# Patient Record
Sex: Female | Born: 1990 | Race: White | Hispanic: No | Marital: Single | Smoking: Never smoker
Health system: Southern US, Community
[De-identification: ages and names within clinical notes are randomized; demographics above are authoritative.]

## PROBLEM LIST (undated history)

## (undated) DIAGNOSIS — R1115 Cyclical vomiting syndrome unrelated to migraine: Secondary | ICD-10-CM

## (undated) DIAGNOSIS — F32A Depression, unspecified: Secondary | ICD-10-CM

## (undated) DIAGNOSIS — F419 Anxiety disorder, unspecified: Secondary | ICD-10-CM

## (undated) DIAGNOSIS — J45909 Unspecified asthma, uncomplicated: Secondary | ICD-10-CM

## (undated) DIAGNOSIS — G43909 Migraine, unspecified, not intractable, without status migrainosus: Secondary | ICD-10-CM

## (undated) DIAGNOSIS — N83209 Unspecified ovarian cyst, unspecified side: Secondary | ICD-10-CM

## (undated) DIAGNOSIS — F329 Major depressive disorder, single episode, unspecified: Secondary | ICD-10-CM

## (undated) HISTORY — PX: OTHER SURGICAL HISTORY: SHX169

## (undated) HISTORY — PX: ESOPHAGOGASTRODUODENOSCOPY: SHX1529

---
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
## 2011-09-26 ENCOUNTER — Encounter (HOSPITAL_COMMUNITY): Payer: Self-pay | Admitting: *Deleted

## 2011-09-26 ENCOUNTER — Observation Stay (HOSPITAL_COMMUNITY)
Admission: EM | Admit: 2011-09-26 | Discharge: 2011-09-26 | Disposition: A | Discharge status: Home / Self Care | Payer: Private Health Insurance - Indemnity | Attending: Emergency Medicine | Admitting: Emergency Medicine

## 2011-09-26 ENCOUNTER — Observation Stay (HOSPITAL_COMMUNITY): Payer: Private Health Insurance - Indemnity

## 2011-09-26 DIAGNOSIS — F329 Major depressive disorder, single episode, unspecified: Secondary | ICD-10-CM | POA: Insufficient documentation

## 2011-09-26 DIAGNOSIS — N83209 Unspecified ovarian cyst, unspecified side: Secondary | ICD-10-CM | POA: Insufficient documentation

## 2011-09-26 DIAGNOSIS — F411 Generalized anxiety disorder: Secondary | ICD-10-CM | POA: Insufficient documentation

## 2011-09-26 DIAGNOSIS — J45909 Unspecified asthma, uncomplicated: Secondary | ICD-10-CM | POA: Insufficient documentation

## 2011-09-26 DIAGNOSIS — E876 Hypokalemia: Secondary | ICD-10-CM

## 2011-09-26 DIAGNOSIS — F3289 Other specified depressive episodes: Secondary | ICD-10-CM | POA: Insufficient documentation

## 2011-09-26 DIAGNOSIS — R1011 Right upper quadrant pain: Principal | ICD-10-CM | POA: Insufficient documentation

## 2011-09-26 DIAGNOSIS — R112 Nausea with vomiting, unspecified: Secondary | ICD-10-CM | POA: Insufficient documentation

## 2011-09-26 DIAGNOSIS — E860 Dehydration: Secondary | ICD-10-CM | POA: Insufficient documentation

## 2011-09-26 HISTORY — DX: Unspecified asthma, uncomplicated: J45.909

## 2011-09-26 HISTORY — DX: Unspecified ovarian cyst, unspecified side: N83.209

## 2011-09-26 HISTORY — DX: Depression, unspecified: F32.A

## 2011-09-26 HISTORY — DX: Major depressive disorder, single episode, unspecified: F32.9

## 2011-09-26 HISTORY — DX: Migraine, unspecified, not intractable, without status migrainosus: G43.909

## 2011-09-26 HISTORY — DX: Anxiety disorder, unspecified: F41.9

## 2011-09-26 LAB — POCT I-STAT, CHEM 8
BUN: 21 mg/dL (ref 6–23)
Calcium, Ion: 1.04 mmol/L — ABNORMAL LOW (ref 1.12–1.23)
Chloride: 106 meq/L (ref 96–112)
Creatinine, Ser: 0.7 mg/dL (ref 0.50–1.10)
Glucose, Bld: 86 mg/dL (ref 70–99)
HCT: 36 % (ref 36.0–46.0)
Hemoglobin: 12.2 g/dL (ref 12.0–15.0)
Potassium: 3.2 meq/L — ABNORMAL LOW (ref 3.5–5.1)
Sodium: 142 meq/L (ref 135–145)
TCO2: 21 mmol/L (ref 0–100)

## 2011-09-26 LAB — COMPREHENSIVE METABOLIC PANEL WITH GFR
ALT: 9 U/L (ref 0–35)
AST: 13 U/L (ref 0–37)
Albumin: 5 g/dL (ref 3.5–5.2)
Alkaline Phosphatase: 78 U/L (ref 39–117)
BUN: 27 mg/dL — ABNORMAL HIGH (ref 6–23)
CO2: 23 meq/L (ref 19–32)
Calcium: 10.2 mg/dL (ref 8.4–10.5)
Chloride: 96 meq/L (ref 96–112)
Creatinine, Ser: 0.78 mg/dL (ref 0.50–1.10)
GFR calc Af Amer: >90 mL/min (ref >90)
GFR calc non Af Amer: >90 mL/min (ref >90)
Glucose, Bld: 111 mg/dL — ABNORMAL HIGH (ref 70–99)
Potassium: 2.8 meq/L — ABNORMAL LOW (ref 3.5–5.1)
Sodium: 137 meq/L (ref 135–145)
Total Bilirubin: 1.1 mg/dL (ref 0.3–1.2)
Total Protein: 8.8 g/dL — ABNORMAL HIGH (ref 6.0–8.3)

## 2011-09-26 LAB — URINALYSIS, ROUTINE W REFLEX MICROSCOPIC
Nitrite: NEGATIVE
Specific Gravity, Urine: 1.027 (ref 1.005–1.030)
Urobilinogen, UA: 1 mg/dL (ref 0.0–1.0)

## 2011-09-26 LAB — CBC WITH DIFFERENTIAL/PLATELET
Eosinophils Relative: 0 % (ref 0–5)
HCT: 45.5 % (ref 36.0–46.0)
Hemoglobin: 16.4 g/dL — ABNORMAL HIGH (ref 12.0–15.0)
Lymphocytes Relative: 28 % (ref 12–46)
Lymphs Abs: 2.4 10*3/uL (ref 0.7–4.0)
MCV: 82 fL (ref 78.0–100.0)
Monocytes Absolute: 0.8 10*3/uL (ref 0.1–1.0)
Neutro Abs: 5.3 10*3/uL (ref 1.7–7.7)
RBC: 5.55 MIL/uL — ABNORMAL HIGH (ref 3.87–5.11)
WBC: 8.5 10*3/uL (ref 4.0–10.5)

## 2011-09-26 LAB — PREGNANCY, URINE: Preg Test, Ur: NEGATIVE

## 2011-09-26 LAB — URINE MICROSCOPIC-ADD ON

## 2011-09-26 LAB — LIPASE, BLOOD: Lipase: 32 U/L (ref 11–59)

## 2011-09-26 MED ORDER — ONDANSETRON HCL 4 MG/2ML IJ SOLN
4.0000 mg | Freq: Once | INTRAMUSCULAR | Status: AC
  Administered 2011-09-26: 4 mg via INTRAVENOUS

## 2011-09-26 MED ORDER — SODIUM CHLORIDE 0.9 % IV SOLN: 1000.0000 mL | Freq: Once | INTRAVENOUS | Status: DC

## 2011-09-26 MED ORDER — ONDANSETRON HCL 4 MG/2ML IJ SOLN
4.0000 mg | Freq: Once | INTRAMUSCULAR | Status: AC
  Administered 2011-09-26: 4 mg via INTRAVENOUS
  Filled 2011-09-26: qty 2

## 2011-09-26 MED ORDER — SODIUM CHLORIDE 0.9 % IV SOLN
1000.0000 mL | Freq: Once | INTRAVENOUS | Status: AC
  Administered 2011-09-26: 1000 mL via INTRAVENOUS

## 2011-09-26 MED ORDER — SODIUM CHLORIDE 0.9 % IV SOLN
1000.0000 mL | INTRAVENOUS | Status: DC
  Administered 2011-09-26: 1000 mL via INTRAVENOUS

## 2011-09-26 MED ORDER — POTASSIUM CHLORIDE 10 MEQ/100ML IV SOLN
10.0000 meq | Freq: Once | INTRAVENOUS | Status: AC
  Administered 2011-09-26: 10 meq via INTRAVENOUS
  Filled 2011-09-26: qty 100

## 2011-09-26 MED ORDER — SODIUM CHLORIDE 0.9 % IV BOLUS (SEPSIS)
1000.0000 mL | Freq: Once | INTRAVENOUS | Status: AC
  Administered 2011-09-26: 1000 mL via INTRAVENOUS

## 2011-09-26 MED ORDER — MORPHINE SULFATE 4 MG/ML IJ SOLN
4.0000 mg | Freq: Once | INTRAMUSCULAR | Status: AC
  Administered 2011-09-26: 4 mg via INTRAVENOUS
  Filled 2011-09-26: qty 1

## 2011-09-26 MED ORDER — ONDANSETRON HCL 4 MG PO TABS: 4.0000 mg | ORAL_TABLET | Freq: Four times a day (QID) | ORAL | Status: DC

## 2011-09-26 MED ORDER — ONDANSETRON HCL 4 MG/2ML IJ SOLN
4.0000 mg | Freq: Four times a day (QID) | INTRAMUSCULAR | Status: DC | PRN
  Filled 2011-09-26: qty 2

## 2011-09-26 MED ORDER — MORPHINE SULFATE 10 MG/ML IJ SOLN: 2.0000 mg | INTRAMUSCULAR | Status: DC | PRN

## 2011-09-26 NOTE — ED Notes (Signed)
Pt reports  RUQ abd pain since Sunday associated with nausea and vomiting. Had diarrhea last week. Sent from [facility] for followup on dehydration and RUQ pain.

## 2011-09-26 NOTE — ED Notes (Signed)
Clear Liquid diet ordered  

## 2011-09-26 NOTE — ED Provider Notes (Signed)
Signed out from PA Chapin from pod B/D: This is a 21 year old female with right upper quadrant pain and multiple episodes of bilious vomiting with diarrhea over the course of 3 days. Patient is pending right upper quadrant ultrasound she is dehydrated with a pulse of 1:30 and her potassium is low at 2.8. She is receiving 10 mEq IV not currently tolerating by mouth.   5:31 PM patient evaluated in in no acute distress. She is now tolerating by mouth and is pain-free with respect to the abdomen but she has a headache at this point.. She is eating liquids and reports nausea. Heart has regular rate and rhythm with no murmurs rubs or gallops, lung sounds are clear to auscultation bilaterally, abdomen is nontender to palpation.   I do not believe the patient can tolerate by mouth potassium supplementation I will give her another liter of fluids, 10 mEq of potassium through the IV, 4 mg of morphine and 4 mg of Zofran IV.  We discussed the results of her ultrasound and the abnormality which will require a followup in 3-6 months. Her and her family verbalized understanding.  Patient is tolerating by mouth and abdominal exam remains benign. Repeat i-STAT shows potassium to be 3.2 after IV repletion. Patient stable to go home and has been hydrated with 4 L.      Wynetta Emery, PA-C 09/26/11 2050

## 2011-09-26 NOTE — ED Notes (Signed)
Pt has gone to ultrasound. Will come to cdu when completed.

## 2011-09-26 NOTE — Progress Notes (Signed)
Observation review is complete. 

## 2011-09-26 NOTE — ED Notes (Signed)
Report called to [facility].  Pt to [place] then to CDU.

## 2011-09-26 NOTE — ED Notes (Signed)
Pt in cdu for hydration and pain and nausea control.

## 2011-09-26 NOTE — ED Provider Notes (Signed)
History     CSN: [id number]  Arrival date & time 09/26/11  1246   First MD Initiated Contact with Patient 09/26/11 1437      Chief Complaint  Patient presents with  . Abdominal Pain  . Nausea  . Emesis    (Consider location/radiation/quality/duration/timing/severity/associated sxs/prior treatment) HPI  21 year old female with history of ovarian cyst presents complaining of abdominal pain. Patient reports for the past 3 days she has had persistent right upper quadrant abdominal pain. Describe pain as a sharp and stabbing sensation worsening after eating. She experienced nausea and vomiting along with diarrhea. Vomitus is nonbilious nonbloody. Nonbloody diarrhea. Patient felt dehydrated, unable to keep anything down. She has subjective fever and chills. She denies chest pain, shortness of breath, cough, urinary complaints, or rash. There is no prior abdominal surgical history. She was seen at her PCPs and was sent to ER for further management.  Past Medical History  Diagnosis Date  . Anxiety   . Depression   . Ovarian cyst   . Asthma   . Migraines     History reviewed. No pertinent past surgical history.  History reviewed. No pertinent family history.  History  Substance Use Topics  . Smoking status: Never Smoker   . Smokeless tobacco: Not on file  . Alcohol Use: No    OB History    Grav Para Term Preterm Abortions TAB SAB Ect Mult Living                  Review of Systems  All other systems reviewed and are negative.    Allergies  Darvocet  Home Medications  No current outpatient prescriptions on file.  BP 138/95  Pulse 131  Temp 98.9 F (37.2 C) (Oral)  Resp 16  SpO2 97%  Physical Exam  Nursing note and vitals reviewed. Constitutional: She appears well-developed and well-nourished. No distress.       Awake, alert, nontoxic appearance  HENT:  Head: Atraumatic.       Lips dry  Eyes: Conjunctivae are normal. Right eye exhibits no discharge. Left  eye exhibits no discharge.  Neck: Neck supple.  Cardiovascular: Normal rate and regular rhythm.   Pulmonary/Chest: Effort normal. No respiratory distress. She exhibits no tenderness.  Abdominal: Soft. There is tenderness in the right upper quadrant. There is guarding, CVA tenderness and tenderness at McBurney's point. There is no rigidity and no rebound. No hernia.    Musculoskeletal: She exhibits no tenderness.       ROM appears intact, no obvious focal weakness  Neurological:       Mental status and motor strength appears intact  Skin: No rash noted.  Psychiatric: She has a normal mood and affect.    ED Course  Procedures (including critical care time)  Labs Reviewed  CBC WITH DIFFERENTIAL - Abnormal; Notable for the following:    RBC 5.55 (*)     Hemoglobin 16.4 (*)     All other components within normal limits  COMPREHENSIVE METABOLIC PANEL - Abnormal; Notable for the following:    Potassium 2.8 (*)     Glucose, Bld 111 (*)     BUN 27 (*)     Total Protein 8.8 (*)     All other components within normal limits  LIPASE, BLOOD  AMYLASE   Results for orders placed during the hospital encounter of 09/26/11  CBC WITH DIFFERENTIAL      Component Value Range   WBC 8.5  4.0 - 10.5 K/uL  RBC 5.55 (*) 3.87 - 5.11 MIL/uL   Hemoglobin 16.4 (*) 12.0 - 15.0 g/dL   HCT 11.9  14.7 - 82.9 %   MCV 82.0  78.0 - 100.0 fL   MCH 29.5  26.0 - 34.0 pg   MCHC 36.0  30.0 - 36.0 g/dL   RDW 56.2  13.0 - 86.5 %   Platelets 311  150 - 400 K/uL   Neutrophils Relative 62  43 - 77 %   Neutro Abs 5.3  1.7 - 7.7 K/uL   Lymphocytes Relative 28  12 - 46 %   Lymphs Abs 2.4  0.7 - 4.0 K/uL   Monocytes Relative 9  3 - 12 %   Monocytes Absolute 0.8  0.1 - 1.0 K/uL   Eosinophils Relative 0  0 - 5 %   Eosinophils Absolute 0.0  0.0 - 0.7 K/uL   Basophils Relative 0  0 - 1 %   Basophils Absolute 0.0  0.0 - 0.1 K/uL  COMPREHENSIVE METABOLIC PANEL      Component Value Range   Sodium 137  135 - 145 mEq/L    Potassium 2.8 (*) 3.5 - 5.1 mEq/L   Chloride 96  96 - 112 mEq/L   CO2 23  19 - 32 mEq/L   Glucose, Bld 111 (*) 70 - 99 mg/dL   BUN 27 (*) 6 - 23 mg/dL   Creatinine, Ser 7.84  0.50 - 1.10 mg/dL   Calcium 69.6  8.4 - 29.5 mg/dL   Total Protein 8.8 (*) 6.0 - 8.3 g/dL   Albumin 5.0  3.5 - 5.2 g/dL   AST 13  0 - 37 U/L   ALT 9  0 - 35 U/L   Alkaline Phosphatase 78  39 - 117 U/L   Total Bilirubin 1.1  0.3 - 1.2 mg/dL   GFR calc non Af Amer >90  >90 mL/min   GFR calc Af Amer >90  >90 mL/min  LIPASE, BLOOD      Component Value Range   Lipase 32  11 - 59 U/L  AMYLASE      Component Value Range   Amylase 58  0 - 105 U/L   No results found.   1. RUQ abd pain 2. dehydration  MDM  Patient symptoms is suggestive of gallbladder etiology. Patient is tachycardic and appears dehydrated. Labs ordered. Abdominal ultrasound ordered to evaluate for gallbladder etiology. IV fluid given along with pain medication and antinausea medication.  3:11 PM Discussed with my attending and also with CDU PA who will continue management.  Pt is dehydrated, and will be placed on CDU for dehydration.  Abd [place] to evaluate or her gallbladder is pending.  K+ is being replaced via IV, but will need further management.         Fayrene Helper, PA-C 09/26/11 1513

## 2011-09-26 NOTE — ED Notes (Signed)
Pt has arrived in cdu from ultrasound

## 2011-09-28 NOTE — ED Provider Notes (Signed)
Medical screening examination/treatment/procedure(s) were conducted as a shared visit with non-physician practitioner(s) and myself.  I personally evaluated the patient during the encounter  Toy Baker, MD 09/28/11 2352

## 2011-09-29 ENCOUNTER — Other Ambulatory Visit (HOSPITAL_COMMUNITY): Payer: Private Health Insurance - Indemnity

## 2011-09-29 ENCOUNTER — Emergency Department (HOSPITAL_COMMUNITY)
Admission: EM | Admit: 2011-09-29 | Discharge: 2011-09-29 | Disposition: A | Discharge status: Home / Self Care | Payer: Private Health Insurance - Indemnity | Attending: Emergency Medicine | Admitting: Emergency Medicine

## 2011-09-29 ENCOUNTER — Emergency Department (HOSPITAL_COMMUNITY): Payer: Private Health Insurance - Indemnity

## 2011-09-29 ENCOUNTER — Encounter (HOSPITAL_COMMUNITY): Payer: Self-pay | Admitting: *Deleted

## 2011-09-29 DIAGNOSIS — R109 Unspecified abdominal pain: Secondary | ICD-10-CM | POA: Insufficient documentation

## 2011-09-29 DIAGNOSIS — R10819 Abdominal tenderness, unspecified site: Secondary | ICD-10-CM | POA: Insufficient documentation

## 2011-09-29 DIAGNOSIS — R112 Nausea with vomiting, unspecified: Secondary | ICD-10-CM | POA: Insufficient documentation

## 2011-09-29 LAB — CBC WITH DIFFERENTIAL/PLATELET
Basophils Relative: 0 % (ref 0–1)
Eosinophils Absolute: 0 10*3/uL (ref 0.0–0.7)
HCT: 42.1 % (ref 36.0–46.0)
Hemoglobin: 15.2 g/dL — ABNORMAL HIGH (ref 12.0–15.0)
MCH: 29.4 pg (ref 26.0–34.0)
MCHC: 36.1 g/dL — ABNORMAL HIGH (ref 30.0–36.0)
Monocytes Absolute: 0.3 10*3/uL (ref 0.1–1.0)
Monocytes Relative: 5 % (ref 3–12)
Neutro Abs: 5 10*3/uL (ref 1.7–7.7)

## 2011-09-29 LAB — COMPREHENSIVE METABOLIC PANEL
ALT: 9 U/L (ref 0–35)
AST: 15 U/L (ref 0–37)
Calcium: 9.6 mg/dL (ref 8.4–10.5)
Sodium: 137 mEq/L (ref 135–145)
Total Protein: 7.9 g/dL (ref 6.0–8.3)

## 2011-09-29 LAB — URINALYSIS, ROUTINE W REFLEX MICROSCOPIC
Glucose, UA: NEGATIVE mg/dL
Protein, ur: 30 mg/dL — AB
Urobilinogen, UA: 1 mg/dL (ref 0.0–1.0)

## 2011-09-29 LAB — URINE MICROSCOPIC-ADD ON

## 2011-09-29 MED ORDER — ONDANSETRON HCL 4 MG PO TABS: 4.0000 mg | ORAL_TABLET | Freq: Four times a day (QID) | ORAL | Status: AC

## 2011-09-29 MED ORDER — SUCRALFATE 1 G PO TABS
1.0000 g | ORAL_TABLET | ORAL | Status: AC
  Administered 2011-09-29: 1 g via ORAL
  Filled 2011-09-29: qty 1

## 2011-09-29 MED ORDER — ONDANSETRON HCL 4 MG/2ML IJ SOLN
4.0000 mg | Freq: Once | INTRAMUSCULAR | Status: DC
  Filled 2011-09-29: qty 2

## 2011-09-29 MED ORDER — IOHEXOL 300 MG/ML  SOLN
20.0000 mL | INTRAMUSCULAR | Status: AC
  Administered 2011-09-29: 20 mL via ORAL

## 2011-09-29 MED ORDER — PANTOPRAZOLE SODIUM 40 MG PO TBEC
40.0000 mg | DELAYED_RELEASE_TABLET | Freq: Once | ORAL | Status: AC
  Administered 2011-09-29: 40 mg via ORAL
  Filled 2011-09-29: qty 1

## 2011-09-29 MED ORDER — ONDANSETRON 4 MG PO TBDP
4.0000 mg | ORAL_TABLET | Freq: Once | ORAL | Status: AC
  Administered 2011-09-29: 4 mg via ORAL
  Filled 2011-09-29: qty 1

## 2011-09-29 MED ORDER — SODIUM CHLORIDE 0.9 % IV SOLN: INTRAVENOUS | Status: DC

## 2011-09-29 MED ORDER — ONDANSETRON HCL 4 MG/2ML IJ SOLN
4.0000 mg | Freq: Once | INTRAMUSCULAR | Status: AC
  Administered 2011-09-29: 4 mg via INTRAMUSCULAR

## 2011-09-29 MED ORDER — PANTOPRAZOLE SODIUM 40 MG PO TBEC: 40.0000 mg | DELAYED_RELEASE_TABLET | Freq: Every day | ORAL | Status: DC

## 2011-09-29 MED ORDER — POTASSIUM CHLORIDE CRYS ER 20 MEQ PO TBCR
40.0000 meq | EXTENDED_RELEASE_TABLET | Freq: Once | ORAL | Status: AC
  Administered 2011-09-29: 40 meq via ORAL
  Filled 2011-09-29: qty 2

## 2011-09-29 MED ORDER — SODIUM CHLORIDE 0.9 % IV BOLUS (SEPSIS): 1000.0000 mL | Freq: Once | INTRAVENOUS | Status: DC

## 2011-09-29 MED ORDER — SUCRALFATE 1 G PO TABS: 1.0000 g | ORAL_TABLET | Freq: Four times a day (QID) | ORAL | Status: DC

## 2011-09-29 MED ORDER — ONDANSETRON HCL 4 MG/2ML IJ SOLN
INTRAMUSCULAR | Status: AC
  Filled 2011-09-29: qty 2

## 2011-09-29 MED ORDER — HYDROMORPHONE HCL PF 1 MG/ML IJ SOLN
0.5000 mg | Freq: Once | INTRAMUSCULAR | Status: AC
  Administered 2011-09-29: 0.5 mg via INTRAVENOUS
  Filled 2011-09-29: qty 1

## 2011-09-29 NOTE — ED Notes (Signed)
Patient transported to CT 

## 2011-09-29 NOTE — ED Provider Notes (Addendum)
History     CSN: [id number]  Arrival date & time 09/29/11  1247   First MD Initiated Contact with Patient 09/29/11 [phone]      Chief Complaint  Patient presents with  . Emesis    (Consider location/radiation/quality/duration/timing/severity/associated sxs/prior treatment) Patient is a 21 y.o. female presenting with vomiting. The history is provided by the patient.  Emesis    patient here with right-sided abdominal pain since Wednesday. Seen here for same and diagnosed with dehydration and hypokalemia. Continues to have sharp stabbing pain which is been constant. No urinary symptoms. Saw her Dr. yesterday and was diagnosed with a viral illness. Denies any vaginal bleeding or discharge. She is currently on her menstrual cycle. No prior abdominal surgeries. Nothing makes her symptoms better. Denies any fevers at home  Past Medical History  Diagnosis Date  . Anxiety   . Depression   . Ovarian cyst   . Asthma   . Migraines     History reviewed. No pertinent past surgical history.  No family history on file.  History  Substance Use Topics  . Smoking status: Never Smoker   . Smokeless tobacco: Not on file  . Alcohol Use: No    OB History    Grav Para Term Preterm Abortions TAB SAB Ect Mult Living                  Review of Systems  Gastrointestinal: Positive for vomiting.  All other systems reviewed and are negative.    Allergies  Banana; Orange concentrate; Other; Peach; Tomato; Watermelon concentrate; Darvocet; and Milk-related compounds  Home Medications   Current Outpatient Rx  Name Route Sig Dispense Refill  . ALPRAZOLAM 0.25 MG PO TABS Oral Take 0.125-0.25 mg by mouth at bedtime as needed. For anxiety    . ESCITALOPRAM OXALATE 10 MG PO TABS Oral Take 10 mg by mouth at bedtime.    Marland Kitchen HYOSCYAMINE SULFATE 0.125 MG SL SUBL Sublingual Place 0.125 mg under the tongue every 4 (four) hours as needed. For stomach pain    . LEVOCETIRIZINE DIHYDROCHLORIDE 5 MG PO TABS  Oral Take 5 mg by mouth every evening.    Marland Kitchen MECLIZINE HCL 25 MG PO TABS Oral Take 25 mg by mouth 3 (three) times daily as needed. For nausea    . PROMETHAZINE HCL 25 MG PO TABS Oral Take 25 mg by mouth every 6 (six) hours as needed. For nausea      BP 121/80  Pulse 81  Temp 99.4 F (37.4 C) (Oral)  Resp 16  SpO2 100%  LMP 09/25/2011  Physical Exam  Nursing note and vitals reviewed. Constitutional: She is oriented to person, place, and time. She appears well-developed and well-nourished.  Non-toxic appearance. No distress.  HENT:  Head: Normocephalic and atraumatic.  Eyes: Conjunctivae, EOM and lids are normal. Pupils are equal, round, and reactive to light.  Neck: Normal range of motion. Neck supple. No tracheal deviation present. No mass present.  Cardiovascular: Normal rate, regular rhythm and normal heart sounds.  Exam reveals no gallop.   No murmur heard. Pulmonary/Chest: Effort normal and breath sounds normal. No stridor. No respiratory distress. She has no decreased breath sounds. She has no wheezes. She has no rhonchi. She has no rales.  Abdominal: Soft. Normal appearance and bowel sounds are normal. She exhibits no distension. There is tenderness in the right upper quadrant and right lower quadrant. There is no rigidity, no rebound, no guarding and no CVA tenderness.  Musculoskeletal: Normal  range of motion. She exhibits no edema and no tenderness.  Neurological: She is alert and oriented to person, place, and time. She has normal strength. No cranial nerve deficit or sensory deficit. GCS eye subscore is 4. GCS verbal subscore is 5. GCS motor subscore is 6.  Skin: Skin is warm and dry. No abrasion and no rash noted.  Psychiatric: She has a normal mood and affect. Her speech is normal and behavior is normal.    ED Course  Procedures (including critical care time)  Labs Reviewed  COMPREHENSIVE METABOLIC PANEL - Abnormal; Notable for the following:    Potassium 3.3 (*)      All other components within normal limits  CBC WITH DIFFERENTIAL - Abnormal; Notable for the following:    RBC 5.17 (*)     Hemoglobin 15.2 (*)     MCHC 36.1 (*)     All other components within normal limits  URINALYSIS, ROUTINE W REFLEX MICROSCOPIC   No results found.   No diagnosis found.    MDM  Patient given Zofran and pain medication and feels better. Able to take fluids now. Suspect that patient has peptic ulcer disease. Will give patient Carafate and PPI was and she will followup with her doctor on Tuesday to get a GI referral. Patient's hemodynamic are stable at time of discharge   Spoke with patient at length and patient does note increased stress and anxiety. Suspect that this is making her likely ulcer disease worse. Patient's abdomen was reexamined at time of discharge and is nonsurgical at this time.     Toy Baker, MD 09/29/11 7829  Toy Baker, MD 09/29/11 2236

## 2011-09-29 NOTE — ED Notes (Signed)
Pt was seen here on Wednesday and was dehydrated with stomach virus and low potassium.  Sent home and followed up with doctor on Friday.  Pt has right flank pain and pain to righ upper quad.  Pt has been nauseated with vomiting since Wednesday and worse today.  No vaginal discharge or bleeding--menstral started on Tuesday and stopped

## 2011-09-29 NOTE — ED Notes (Signed)
Pt knows we need a urine sample from her 

## 2011-09-30 NOTE — ED Provider Notes (Signed)
Medical screening examination/treatment/procedure(s) were performed by non-physician practitioner and as supervising physician I was immediately available for consultation/collaboration.  Cheri Guppy, MD 09/30/11 [phone]

## 2011-10-05 ENCOUNTER — Other Ambulatory Visit (HOSPITAL_COMMUNITY): Payer: Self-pay | Admitting: Gastroenterology

## 2011-10-05 DIAGNOSIS — R112 Nausea with vomiting, unspecified: Secondary | ICD-10-CM

## 2011-10-06 ENCOUNTER — Emergency Department (HOSPITAL_COMMUNITY): Payer: Managed Care, Other (non HMO)

## 2011-10-06 ENCOUNTER — Inpatient Hospital Stay (HOSPITAL_COMMUNITY)
Admission: EM | Admit: 2011-10-06 | Discharge: 2011-10-09 | DRG: 392 | Disposition: A | Discharge status: Home / Self Care | Payer: Managed Care, Other (non HMO) | Attending: Internal Medicine | Admitting: Internal Medicine

## 2011-10-06 ENCOUNTER — Encounter (HOSPITAL_COMMUNITY): Payer: Self-pay | Admitting: Emergency Medicine

## 2011-10-06 DIAGNOSIS — R112 Nausea with vomiting, unspecified: Secondary | ICD-10-CM | POA: Diagnosis present

## 2011-10-06 DIAGNOSIS — R1110 Vomiting, unspecified: Secondary | ICD-10-CM

## 2011-10-06 DIAGNOSIS — E876 Hypokalemia: Secondary | ICD-10-CM | POA: Diagnosis present

## 2011-10-06 DIAGNOSIS — R1011 Right upper quadrant pain: Principal | ICD-10-CM | POA: Diagnosis present

## 2011-10-06 DIAGNOSIS — R634 Abnormal weight loss: Secondary | ICD-10-CM | POA: Diagnosis present

## 2011-10-06 DIAGNOSIS — F3289 Other specified depressive episodes: Secondary | ICD-10-CM | POA: Diagnosis present

## 2011-10-06 DIAGNOSIS — J45909 Unspecified asthma, uncomplicated: Secondary | ICD-10-CM | POA: Diagnosis present

## 2011-10-06 DIAGNOSIS — E860 Dehydration: Secondary | ICD-10-CM

## 2011-10-06 DIAGNOSIS — Z79899 Other long term (current) drug therapy: Secondary | ICD-10-CM

## 2011-10-06 DIAGNOSIS — K8050 Calculus of bile duct without cholangitis or cholecystitis without obstruction: Secondary | ICD-10-CM | POA: Diagnosis present

## 2011-10-06 DIAGNOSIS — F411 Generalized anxiety disorder: Secondary | ICD-10-CM | POA: Diagnosis present

## 2011-10-06 DIAGNOSIS — F329 Major depressive disorder, single episode, unspecified: Secondary | ICD-10-CM | POA: Diagnosis present

## 2011-10-06 LAB — CBC WITH DIFFERENTIAL/PLATELET
Basophils Absolute: 0 10*3/uL (ref 0.0–0.1)
Basophils Relative: 0 % (ref 0–1)
Hemoglobin: 15.3 g/dL — ABNORMAL HIGH (ref 12.0–15.0)
MCHC: 35.9 g/dL (ref 30.0–36.0)
Monocytes Relative: 7 % (ref 3–12)
Neutro Abs: 4 10*3/uL (ref 1.7–7.7)
Neutrophils Relative %: 65 % (ref 43–77)
Platelets: 286 10*3/uL (ref 150–400)

## 2011-10-06 LAB — URINE MICROSCOPIC-ADD ON

## 2011-10-06 LAB — COMPREHENSIVE METABOLIC PANEL
ALT: 12 U/L (ref 0–35)
AST: 15 U/L (ref 0–37)
Albumin: 4.9 g/dL (ref 3.5–5.2)
Alkaline Phosphatase: 70 U/L (ref 39–117)
BUN: 11 mg/dL (ref 6–23)
Chloride: 96 mEq/L (ref 96–112)
Potassium: 3 mEq/L — ABNORMAL LOW (ref 3.5–5.1)
Sodium: 136 mEq/L (ref 135–145)
Total Bilirubin: 0.4 mg/dL (ref 0.3–1.2)

## 2011-10-06 LAB — URINALYSIS, ROUTINE W REFLEX MICROSCOPIC
Bilirubin Urine: NEGATIVE
Glucose, UA: NEGATIVE mg/dL
Hgb urine dipstick: NEGATIVE
Protein, ur: NEGATIVE mg/dL
pH: 7 (ref 5.0–8.0)

## 2011-10-06 LAB — POCT PREGNANCY, URINE: Preg Test, Ur: NEGATIVE

## 2011-10-06 MED ORDER — SODIUM CHLORIDE 0.9 % IV BOLUS (SEPSIS)
1000.0000 mL | Freq: Once | INTRAVENOUS | Status: AC
  Administered 2011-10-06: 1000 mL via INTRAVENOUS

## 2011-10-06 MED ORDER — POTASSIUM CHLORIDE CRYS ER 20 MEQ PO TBCR
40.0000 meq | EXTENDED_RELEASE_TABLET | Freq: Once | ORAL | Status: DC
  Filled 2011-10-06: qty 2

## 2011-10-06 MED ORDER — POTASSIUM CHLORIDE CRYS ER 20 MEQ PO TBCR
40.0000 meq | EXTENDED_RELEASE_TABLET | Freq: Once | ORAL | Status: AC
  Administered 2011-10-06: 40 meq via ORAL
  Filled 2011-10-06: qty 2

## 2011-10-06 MED ORDER — POTASSIUM CHLORIDE 10 MEQ/100ML IV SOLN: 10.0000 meq | INTRAVENOUS | Status: DC

## 2011-10-06 MED ORDER — ONDANSETRON HCL 4 MG/2ML IJ SOLN: 4.0000 mg | Freq: Three times a day (TID) | INTRAMUSCULAR | Status: DC | PRN

## 2011-10-06 MED ORDER — ONDANSETRON HCL 4 MG PO TABS
4.0000 mg | ORAL_TABLET | Freq: Four times a day (QID) | ORAL | Status: DC | PRN
  Filled 2011-10-06: qty 1

## 2011-10-06 MED ORDER — ACETAMINOPHEN 500 MG PO TABS
1000.0000 mg | ORAL_TABLET | Freq: Four times a day (QID) | ORAL | Status: DC | PRN
  Administered 2011-10-06 – 2011-10-09 (×5): 1000 mg via ORAL
  Filled 2011-10-06 (×6): qty 2

## 2011-10-06 MED ORDER — PANTOPRAZOLE SODIUM 40 MG IV SOLR
40.0000 mg | INTRAVENOUS | Status: DC
  Administered 2011-10-06 – 2011-10-08 (×3): 40 mg via INTRAVENOUS
  Filled 2011-10-06 (×4): qty 40

## 2011-10-06 MED ORDER — ONDANSETRON HCL 4 MG/2ML IJ SOLN
4.0000 mg | Freq: Four times a day (QID) | INTRAMUSCULAR | Status: DC | PRN
  Administered 2011-10-06 – 2011-10-09 (×7): 4 mg via INTRAVENOUS
  Filled 2011-10-06 (×7): qty 2

## 2011-10-06 MED ORDER — HYDROMORPHONE HCL PF 1 MG/ML IJ SOLN
1.0000 mg | Freq: Once | INTRAMUSCULAR | Status: AC
  Administered 2011-10-06: 1 mg via INTRAVENOUS
  Filled 2011-10-06: qty 1

## 2011-10-06 MED ORDER — HYDROMORPHONE HCL PF 1 MG/ML IJ SOLN
1.0000 mg | INTRAMUSCULAR | Status: DC | PRN
  Administered 2011-10-06 – 2011-10-09 (×7): 1 mg via INTRAVENOUS
  Filled 2011-10-06 (×7): qty 1

## 2011-10-06 MED ORDER — ONDANSETRON HCL 4 MG/2ML IJ SOLN
4.0000 mg | Freq: Once | INTRAMUSCULAR | Status: AC
  Administered 2011-10-06: 4 mg via INTRAVENOUS
  Filled 2011-10-06: qty 2

## 2011-10-06 MED ORDER — HYOSCYAMINE SULFATE 0.125 MG SL SUBL
0.1250 mg | SUBLINGUAL_TABLET | SUBLINGUAL | Status: DC | PRN
  Filled 2011-10-06: qty 1

## 2011-10-06 MED ORDER — SODIUM CHLORIDE 0.9 % IV SOLN: INTRAVENOUS | Status: AC

## 2011-10-06 MED ORDER — SUCRALFATE 1 GM/10ML PO SUSP
1.0000 g | Freq: Four times a day (QID) | ORAL | Status: DC
  Administered 2011-10-06 – 2011-10-09 (×12): 1 g via ORAL
  Filled 2011-10-06 (×14): qty 10

## 2011-10-06 MED ORDER — HYDROMORPHONE HCL PF 1 MG/ML IJ SOLN: 1.0000 mg | INTRAMUSCULAR | Status: DC | PRN

## 2011-10-06 MED ORDER — POTASSIUM CHLORIDE IN NACL 20-0.9 MEQ/L-% IV SOLN
INTRAVENOUS | Status: DC
  Administered 2011-10-06 – 2011-10-09 (×7): via INTRAVENOUS
  Filled 2011-10-06 (×9): qty 1000

## 2011-10-06 NOTE — H&P (Signed)
[facility] History and Physical  [name] ZOX:[id number] DOB: 1990-11-16 DOA: 10/06/2011  Referring physician:David Jackie Plum, MD  PCP: Emeterio Reeve, MD   Chief Complaint: RUQ abdominal pain  HPI: Brenda Warren is a 21 y.o. female with past medical history of anxiety/depression and migraines came in to the hospital complaining about RUQ abdominal pain. Patient started that about 2 weeks ago with nausea and vomiting, she was seen by her primary care physician and it was thought initially to be secondary to viral gastroenteritis. Patient was treated symptomatically but her symptoms persist, she came into the emergency department again for evaluation about a week ago, and she was referred to a gastroenterologist. She did have upper GI endoscopy done yesterday, and she was been told it was okay. Patient still complaining about intractable nausea and vomiting. She does have colicky RUQ abdominal pain. The pain is 3-5/10, pain worsened to 10/10 on she eats. Patient still complaining about pain, nausea/vomiting. She lost 20 pounds over the past 3 weeks. Patient admitted to the hospital for further evaluation. She denies any fever, on initial evaluation in the emergency department today abdominal ultrasound was done and showed no evidence of gallstones.  Review of Systems:  Constitutional: negative for fevers and sweats Eyes: negative for irritation, redness and visual disturbance Ears, nose, mouth, throat, and face: negative for earaches, epistaxis, nasal congestion and sore throat Respiratory: negative for cough, dyspnea on exertion, sputum and wheezing Cardiovascular: negative for chest pain, dyspnea, lower extremity edema, orthopnea, palpitations and syncope Gastrointestinal: Per history of present illness including abdominal pain, nausea/vomiting anorexia Genitourinary:negative for dysuria, frequency and hematuria Hematologic/lymphatic: negative for bleeding, easy bruising and  lymphadenopathy Musculoskeletal:negative for arthralgias, muscle weakness and stiff joints Neurological: negative for coordination problems, gait problems, headaches and weakness Endocrine: negative for diabetic symptoms including polydipsia, polyuria and weight loss Allergic/Immunologic: negative for anaphylaxis, hay fever and urticaria  Past Medical History  Diagnosis Date  . Anxiety   . Depression   . Ovarian cyst   . Asthma   . Migraines    History reviewed. No pertinent past surgical history. Social History:  reports that she has never smoked. She does not have any smokeless tobacco history on file. She reports that she does not drink alcohol or use illicit drugs.   Allergies  Allergen Reactions  . Banana Anaphylaxis  . Orange Concentrate Information systems manager) Anaphylaxis  . Other Itching    Almonds, walnuts, and raw carrots  . Peach (Prunus Persica) Anaphylaxis  . Tomato Anaphylaxis    Raw tomato  . Watermelon Concentrate (Citrullus Vulgaris) Anaphylaxis    Also cantaloupe  . Darvocet (Propoxyphene-Acetaminophen) Itching  . Milk-Related Compounds Diarrhea    Ok with yogurt and cheese    Family History  Problem Relation Age of Onset  . Gallbladder disease Father     Prior to Admission medications   Medication Sig Start Date End Date Taking? Authorizing Provider  acetaminophen (TYLENOL) 500 MG tablet Take 1,000 mg by mouth every 6 (six) hours as needed.   Yes Historical Provider, MD  ALPRAZolam (XANAX) 0.25 MG tablet Take 0.125-0.25 mg by mouth at bedtime as needed. For anxiety   Yes Historical Provider, MD  EPINEPHrine (EPI-PEN) 0.3 mg/0.3 mL DEVI Inject 0.3 mg into the muscle once.   Yes Historical Provider, MD  escitalopram (LEXAPRO) 10 MG tablet Take 10 mg by mouth at bedtime.   Yes Historical Provider, MD  hyoscyamine (LEVSIN SL) 0.125 MG SL tablet Place 0.125 mg under the tongue every  4 (four) hours as needed. For stomach pain   Yes Historical Provider, MD    levocetirizine (XYZAL) 5 MG tablet Take 5 mg by mouth every evening.   Yes Historical Provider, MD  ondansetron (ZOFRAN) 4 MG tablet Take 1 tablet (4 mg total) by mouth every 6 (six) hours. 09/29/11 10/06/11 Yes Toy Baker, MD  pantoprazole (PROTONIX) 40 MG tablet Take 1 tablet (40 mg total) by mouth daily. 09/29/11 09/28/12 Yes Toy Baker, MD  promethazine (PHENERGAN) 25 MG tablet Take 25 mg by mouth every 6 (six) hours as needed. For nausea   Yes Historical Provider, MD  sucralfate (CARAFATE) 1 GM/10ML suspension Take 1 g by mouth 4 (four) times daily.   Yes Historical Provider, MD   Physical Exam: Filed Vitals:   10/06/11 1315 10/06/11 1317 10/06/11 1330 10/06/11 1702  BP:  111/67  127/71  Pulse:  67  72  Temp:  97.4 F (36.3 C)  98.4 F (36.9 C)  TempSrc:  Oral    Resp: 15 10 18    Height:    5' 2.5" (1.588 m)  Weight:    51.71 kg (114 lb)  SpO2:  98%  100%   General appearance: alert, cooperative and no distress  Head: Normocephalic, without obvious abnormality, atraumatic  Eyes: conjunctivae/corneas clear. PERRL, EOM's intact. Fundi benign.  Nose: Nares normal. Septum midline. Mucosa normal. No drainage or sinus tenderness.  Throat: lips, mucosa, and tongue normal; teeth and gums normal  Neck: Supple, no masses, no cervical lymphadenopathy, no JVD appreciated, no meningeal signs Resp: clear to auscultation bilaterally  Chest wall: no tenderness  Cardio: regular rate and rhythm, S1, S2 normal, no murmur, click, rub or gallop  GI: soft, right upper quadrant moderate tenderness Extremities: extremities normal, atraumatic, no cyanosis or edema  Skin: Skin color, texture, turgor normal. No rashes or lesions  Neurologic: Alert and oriented X 3, normal strength and tone. Normal symmetric reflexes. Normal coordination and gait   Labs on Admission:  Basic Metabolic Panel:  Lab 10/06/11 1610  NA 136  K 3.0*  CL 96  CO2 26  GLUCOSE 98  BUN 11  CREATININE 0.82  CALCIUM  9.7  MG --  PHOS --   Liver Function Tests:  Lab 10/06/11 1125  AST 15  ALT 12  ALKPHOS 70  BILITOT 0.4  PROT 8.2  ALBUMIN 4.9    Lab 10/06/11 1125  LIPASE 25  AMYLASE --   No results found for this basename: AMMONIA:5 in the last 168 hours CBC:  Lab 10/06/11 1125  WBC 6.2  NEUTROABS 4.0  HGB 15.3*  HCT 42.6  MCV 81.8  PLT 286   Cardiac Enzymes: No results found for this basename: CKTOTAL:5,CKMB:5,CKMBINDEX:5,TROPONINI:5 in the last 168 hours  BNP (last 3 results) No results found for this basename: PROBNP:3 in the last 8760 hours CBG: No results found for this basename: GLUCAP:5 in the last 168 hours  Radiological Exams on Admission: Dg Chest 2 View  10/06/2011  *RADIOLOGY REPORT*  Clinical Data: Right-sided chest pain  CHEST - 2 VIEW  Comparison: 06/27/2007  Findings: Cardiac leads project over the chest.  Normal heart, mediastinal, and hilar contours.  Normal pulmonary vascularity. The lungs are well expanded and clear.  No acute or suspicious bony abnormality.  The trachea is midline.  IMPRESSION: No acute cardiopulmonary disease.   Original Report Authenticated By: Britta Mccreedy, M.D.    US Abdomen Complete  10/06/2011  *RADIOLOGY REPORT*  Clinical Data:  Right  upper quadrant abdominal pain.  Evaluate for cholecystitis.  COMPLETE ABDOMINAL ULTRASOUND  Comparison:  Abdominal ultrasound 09/26/2011.  Findings:  Gallbladder:  No shadowing gallstones or echogenic sludge.  No gallbladder wall thickening or pericholecystic fluid.  Negative sonographic Murphy's sign according to the ultrasound technologist.  Common bile duct:  Normal caliber measuring 6 mm in the porta hepatis.  Liver:  Normal size and echotexture.  Again noted is a small echogenic region measuring approximately 1.6 x 1.0 x 1.0 cm adjacent to the falciform ligament, most compatible with focal hepatic steatosis (this was visualized on recent CT scan 09/29/2011).  Patent portal vein with hepatopetal flow.  IVC:   Although the pancreas is difficult to visualize in its entirety, no focal pancreatic abnormality is identified.  Pancreas:  Normal size and echotexture without focal parenchymal abnormality.7.5 cm in length.  Spleen:  No hydronephrosis.  Well-preserved cortex.  Normal size and parenchymal echotexture without focal abnormalities. 10.3 cm in length.  Right Kidney:  No hydronephrosis.  Well-preserved cortex.  Normal size and parenchymal echotexture without focal abnormalities. 10.1 cm in length.  Left Kidney:  Measures up to 1.6 cm in diameter proximally, and tapers appropriately distally.  Abdominal aorta:  No aneurysm identified.  IMPRESSION: 1.  No acute findings in the abdomen to account for the patient's symptoms.  Specifically, no evidence of cholelithiasis or findings to suggest acute cholecystitis. 2.  Small focal area of fatty infiltration in the liver adjacent to the falciform ligament is unchanged compared to the prior examination, has been confirmed on interval CT of the abdomen and pelvis 09/29/2011, and is a benign finding requiring no specific imaging follow-up.   Original Report Authenticated By: Florencia Reasons, M.D.     EKG: Independently reviewed.  Assessment/Plan Principal Problem:  *RUQ pain Active Problems:  Biliary colic  Nausea and vomiting  Weight loss   RUQ abdominal pain Patient's symptoms are consistent with biliary colic, patient be evaluated by ultrasound which showed no evidence of cholecystitis or gallstones. Because of patient symptoms and intractable nausea/vomiting I will obtain a HIDA scan. Her symptoms to me is consistent with biliary dyskinesia. If HIDA scan is positive general surgery will be consulted. Till the HIDA scan back I will manage symptomatically with pain medications and antiemetics.  Nausea and vomiting This is likely secondary to her gallbladder disease.  Hypokalemia She does have potassium level of 3.0, this is likely secondary to vomiting, I  will replete orally and parenterally.  Weight loss She lost over 20 pounds of her weight in the past 3 weeks. But somehow she kept her hydration, she does not have any biochemical evidence of prerenal azotemia or acute kidney injury.   Code Status: Full Family Communication: Family at bedside updated with the plan. Disposition Plan: Likely to be discharged home when she is medical stable  Time spent: 65 minutes  [facility] Pager [phone]  If 7PM-7AM, please contact night-coverage [email or web address] Password [email or web address] 10/06/2011, 5:11 PM

## 2011-10-06 NOTE — ED Notes (Signed)
Pt presents w/ RUQ pain that radiates around to back and under her shoulder blade. Nausea w/ emesis continues, states 20 lb weight loss. This has been going on for 2 wks, has seen GI twice, in EDs twice and 4 other physicians. Father endorses early history for GB disease in the family. Has HIDA scan scedhuled for Tuesday.

## 2011-10-06 NOTE — ED Notes (Signed)
Admitting MD at beside. Will transport pt after he finishes examination.

## 2011-10-06 NOTE — ED Notes (Signed)
Attempted to collect urine. Pt getting ultrasound performed.

## 2011-10-06 NOTE — ED Notes (Signed)
RN to obtain labs with start of IV 

## 2011-10-06 NOTE — ED Provider Notes (Signed)
History     CSN: [id number]  Arrival date & time 10/06/11  1041   First MD Initiated Contact with Patient 10/06/11 1057      Chief Complaint  Patient presents with  . Abdominal Pain    (Consider location/radiation/quality/duration/timing/severity/associated sxs/prior treatment) The history is provided by the patient.  Brenda Warren is a 21 y.o. female hx of depression and anxiety here with RUQ pain. RUQ pain x 2 weeks, getting progressively worse. Pain is sharp and now is constant. Radiates up her R shoulder. She was seen 3 times in the ED and had nl RUQ [place], nl CT ab/pel. She also saw a GI doctor and had a normal endoscopy. A HIDA scan was ordered for next week. She was unable to tolerate PO for the past few days and now feels dehydrated. No fevers, + vomiting, no diarrhea. Her father had similar problem in the past and was eventually diagnosed with gangrene of the gallbladder.    Past Medical History  Diagnosis Date  . Anxiety   . Depression   . Ovarian cyst   . Asthma   . Migraines     History reviewed. No pertinent past surgical history.  No family history on file.  History  Substance Use Topics  . Smoking status: Never Smoker   . Smokeless tobacco: Not on file  . Alcohol Use: No    OB History    Grav Para Term Preterm Abortions TAB SAB Ect Mult Living                  Review of Systems  Gastrointestinal: Positive for vomiting and abdominal pain.  All other systems reviewed and are negative.    Allergies  Banana; Orange concentrate; Other; Peach; Tomato; Watermelon concentrate; Darvocet; and Milk-related compounds  Home Medications   Current Outpatient Rx  Name Route Sig Dispense Refill  . ACETAMINOPHEN 500 MG PO TABS Oral Take 1,000 mg by mouth every 6 (six) hours as needed.    . ALPRAZOLAM 0.25 MG PO TABS Oral Take 0.125-0.25 mg by mouth at bedtime as needed. For anxiety    . EPINEPHRINE 0.3 MG/0.3ML IJ DEVI Intramuscular Inject 0.3 mg into the muscle  once.    Marland Kitchen ESCITALOPRAM OXALATE 10 MG PO TABS Oral Take 10 mg by mouth at bedtime.    Marland Kitchen HYOSCYAMINE SULFATE 0.125 MG SL SUBL Sublingual Place 0.125 mg under the tongue every 4 (four) hours as needed. For stomach pain    . LEVOCETIRIZINE DIHYDROCHLORIDE 5 MG PO TABS Oral Take 5 mg by mouth every evening.    Marland Kitchen ONDANSETRON HCL 4 MG PO TABS Oral Take 1 tablet (4 mg total) by mouth every 6 (six) hours. 12 tablet 0  . PANTOPRAZOLE SODIUM 40 MG PO TBEC Oral Take 1 tablet (40 mg total) by mouth daily. 30 tablet 0  . PROMETHAZINE HCL 25 MG PO TABS Oral Take 25 mg by mouth every 6 (six) hours as needed. For nausea    . SUCRALFATE 1 GM/10ML PO SUSP Oral Take 1 g by mouth 4 (four) times daily.      BP 111/67  Pulse 67  Temp 97.4 F (36.3 C) (Oral)  Resp 18  SpO2 98%  LMP 09/25/2011  Physical Exam  Nursing note and vitals reviewed. Constitutional: She is oriented to person, place, and time.       Thin, crying and tearful, holding R side of her abdomen.   HENT:  Head: Normocephalic.  OP slightly dry  Eyes: Conjunctivae are normal. Pupils are equal, round, and reactive to light.  Neck: Normal range of motion. Neck supple.  Cardiovascular: Normal heart sounds.        + tachy  Pulmonary/Chest: Effort normal and breath sounds normal.  Abdominal: Soft.       + RUQ tenderness, + murphy sign  Musculoskeletal: Normal range of motion.  Neurological: She is alert and oriented to person, place, and time.  Skin: Skin is warm and dry.  Psychiatric: She has a normal mood and affect. Her behavior is normal. Judgment and thought content normal.    ED Course  Procedures (including critical care time)  Labs Reviewed  CBC WITH DIFFERENTIAL - Abnormal; Notable for the following:    RBC 5.21 (*)     Hemoglobin 15.3 (*)     All other components within normal limits  COMPREHENSIVE METABOLIC PANEL - Abnormal; Notable for the following:    Potassium 3.0 (*)     All other components within normal limits   URINALYSIS, ROUTINE W REFLEX MICROSCOPIC - Abnormal; Notable for the following:    Ketones, ur TRACE (*)     Leukocytes, UA SMALL (*)     All other components within normal limits  URINE MICROSCOPIC-ADD ON - Abnormal; Notable for the following:    Bacteria, UA FEW (*)     All other components within normal limits  LIPASE, BLOOD  LACTIC ACID, PLASMA  POCT PREGNANCY, URINE   Dg Chest 2 View  10/06/2011  *RADIOLOGY REPORT*  Clinical Data: Right-sided chest pain  CHEST - 2 VIEW  Comparison: 06/27/2007  Findings: Cardiac leads project over the chest.  Normal heart, mediastinal, and hilar contours.  Normal pulmonary vascularity. The lungs are well expanded and clear.  No acute or suspicious bony abnormality.  The trachea is midline.  IMPRESSION: No acute cardiopulmonary disease.   Original Report Authenticated By: Britta Mccreedy, M.D.    US Abdomen Complete  10/06/2011  *RADIOLOGY REPORT*  Clinical Data:  Right upper quadrant abdominal pain.  Evaluate for cholecystitis.  COMPLETE ABDOMINAL ULTRASOUND  Comparison:  Abdominal ultrasound 09/26/2011.  Findings:  Gallbladder:  No shadowing gallstones or echogenic sludge.  No gallbladder wall thickening or pericholecystic fluid.  Negative sonographic Murphy's sign according to the ultrasound technologist.  Common bile duct:  Normal caliber measuring 6 mm in the porta hepatis.  Liver:  Normal size and echotexture.  Again noted is a small echogenic region measuring approximately 1.6 x 1.0 x 1.0 cm adjacent to the falciform ligament, most compatible with focal hepatic steatosis (this was visualized on recent CT scan 09/29/2011).  Patent portal vein with hepatopetal flow.  IVC:  Although the pancreas is difficult to visualize in its entirety, no focal pancreatic abnormality is identified.  Pancreas:  Normal size and echotexture without focal parenchymal abnormality.7.5 cm in length.  Spleen:  No hydronephrosis.  Well-preserved cortex.  Normal size and parenchymal  echotexture without focal abnormalities. 10.3 cm in length.  Right Kidney:  No hydronephrosis.  Well-preserved cortex.  Normal size and parenchymal echotexture without focal abnormalities. 10.1 cm in length.  Left Kidney:  Measures up to 1.6 cm in diameter proximally, and tapers appropriately distally.  Abdominal aorta:  No aneurysm identified.  IMPRESSION: 1.  No acute findings in the abdomen to account for the patient's symptoms.  Specifically, no evidence of cholelithiasis or findings to suggest acute cholecystitis. 2.  Small focal area of fatty infiltration in the liver adjacent to the falciform  ligament is unchanged compared to the prior examination, has been confirmed on interval CT of the abdomen and pelvis 09/29/2011, and is a benign finding requiring no specific imaging follow-up.   Original Report Authenticated By: Florencia Reasons, M.D.      1. Dehydration   2. Vomiting      Date: 10/06/2011  Rate: 87  Rhythm: normal sinus rhythm  QRS Axis: normal  Intervals: normal  ST/T Wave abnormalities: normal  Conduction Disutrbances:none  Narrative Interpretation:   Old EKG Reviewed: none available    MDM  Brenda Warren is a 21 y.o. female here with RUQ pain. Will recheck lipase, CBC, CMP. Will also check lactate and repeat US RUQ since she is still tender. Will hydrate and give pain meds. Will reassess.   2:50 PM Patient still vomiting, unable to tolerate PO after fluids and zofran. [place] RUQ showed no cholecystitis. Labs are normal and urine is contaminated. I called [facility], and will admit for observation and IV hydration.         Richardean Canal, MD 10/06/11 1451

## 2011-10-06 NOTE — Progress Notes (Signed)
History of present illness: Patient is a young female who presents with right upper quadrant pain for past 3 weeks prior to this admission. US done in ED did not reveal gallstones but patient persistently in pain and has an intractable nausea and can not tolerate po intake.  Vital signs reviewed as well as admission labs.  Patient is stable for admission under observation for pain management and intractable nausea and vomiting.  Manson Passey [facility] pager [phone]

## 2011-10-07 DIAGNOSIS — R634 Abnormal weight loss: Secondary | ICD-10-CM

## 2011-10-07 DIAGNOSIS — R112 Nausea with vomiting, unspecified: Secondary | ICD-10-CM

## 2011-10-07 DIAGNOSIS — E860 Dehydration: Secondary | ICD-10-CM

## 2011-10-07 DIAGNOSIS — K8020 Calculus of gallbladder without cholecystitis without obstruction: Secondary | ICD-10-CM

## 2011-10-07 LAB — COMPREHENSIVE METABOLIC PANEL
AST: 11 U/L (ref 0–37)
Albumin: 2.9 g/dL — ABNORMAL LOW (ref 3.5–5.2)
Chloride: 110 mEq/L (ref 96–112)
Creatinine, Ser: 0.77 mg/dL (ref 0.50–1.10)
Potassium: 3.7 mEq/L (ref 3.5–5.1)
Total Bilirubin: 0.2 mg/dL — ABNORMAL LOW (ref 0.3–1.2)
Total Protein: 4.9 g/dL — ABNORMAL LOW (ref 6.0–8.3)

## 2011-10-07 LAB — CBC
HCT: 31.5 % — ABNORMAL LOW (ref 36.0–46.0)
MCH: 29.1 pg (ref 26.0–34.0)
MCV: 84.9 fL (ref 78.0–100.0)
Platelets: 192 10*3/uL (ref 150–400)
RBC: 3.71 MIL/uL — ABNORMAL LOW (ref 3.87–5.11)

## 2011-10-07 MED ORDER — POTASSIUM CHLORIDE CRYS ER 20 MEQ PO TBCR
40.0000 meq | EXTENDED_RELEASE_TABLET | Freq: Once | ORAL | Status: AC
  Administered 2011-10-07: 40 meq via ORAL
  Filled 2011-10-07: qty 2

## 2011-10-07 MED ORDER — DIPHENHYDRAMINE HCL 25 MG PO CAPS
25.0000 mg | ORAL_CAPSULE | Freq: Four times a day (QID) | ORAL | Status: DC | PRN
  Administered 2011-10-07: 25 mg via ORAL
  Filled 2011-10-07: qty 1

## 2011-10-07 NOTE — Progress Notes (Signed)
[facility] PROGRESS NOTE  Brenda Warren WUX:[id number] DOB: 16-Jul-1990 DOA: 10/06/2011 PCP: Emeterio Reeve, MD  Assessment/Plan: Principal Problem:  *RUQ pain Active Problems:  Biliary colic  Nausea and vomiting  Weight loss   RUQ abdominal pain  -Biliary colic, no evidence of acute or chronic cholecystitis. -HIDA scan with ejection fraction in a.m. to rule out biliary dyskinesia. -Meanwhile symptomatic management with pain meds and antiemetics.  Nausea and vomiting  -This is likely secondary to her gallbladder disease.   Hypokalemia  -Secondary to vomiting, repleted orally.  Weight loss  -She lost over 20 pounds of her weight in the past 3 weeks. But somehow she kept her hydration, she does not have any biochemical evidence of prerenal azotemia or acute kidney injury.    Code Status: Full Family Communication: Father at bedside Disposition Plan: Likely home when she is medically stable   Brief narrative: 21 year old Caucasian female with past medical history of anxiety/depression came into the hospital complaining about colicky RUQ abdominal pain.  Consultants:  None  Procedures:  None  Antibiotics:  None  HPI/Subjective: Vomited once since last night, RUQ pain comes and goes  Objective: Filed Vitals:   10/06/11 1330 10/06/11 1702 10/06/11 2130 10/07/11 0545  BP:  127/71 106/61 104/69  Pulse:  72 59 63  Temp:  98.4 F (36.9 C) 98.3 F (36.8 C) 98.2 F (36.8 C)  TempSrc:   Oral Oral  Resp: 18  16 16   Height:  5' 2.5" (1.588 m)    Weight:  51.71 kg (114 lb)    SpO2:  100% 100% 98%    Intake/Output Summary (Last 24 hours) at 10/07/11 1252 Last data filed at 10/07/11 1027  Gross per 24 hour  Intake 3168.33 ml  Output    850 ml  Net 2318.33 ml   Filed Weights   10/06/11 1702  Weight: 51.71 kg (114 lb)    Exam: General: Alert and awake, oriented x3, not in any acute distress. HEENT: anicteric sclera, pupils reactive to light and  accommodation, EOMI CVS: S1-S2 clear, no murmur rubs or gallops Chest: clear to auscultation bilaterally, no wheezing, rales or rhonchi Abdomen: soft , RUQ tenderness Extremities: no cyanosis, clubbing or edema noted bilaterally Neuro: Cranial nerves II-XII intact, no focal neurological deficits  Data Reviewed: Basic Metabolic Panel:  Lab 10/07/11 2536 10/06/11 1125  NA 138 136  K 3.7 3.0*  CL 110 96  CO2 24 26  GLUCOSE 84 98  BUN 6 11  CREATININE 0.77 0.82  CALCIUM 8.0* 9.7  MG -- --  PHOS -- --   Liver Function Tests:  Lab 10/07/11 0432 10/06/11 1125  AST 11 15  ALT 8 12  ALKPHOS 43 70  BILITOT 0.2* 0.4  PROT 4.9* 8.2  ALBUMIN 2.9* 4.9    Lab 10/06/11 1125  LIPASE 25  AMYLASE --   No results found for this basename: AMMONIA:5 in the last 168 hours CBC:  Lab 10/07/11 0432 10/06/11 1125  WBC 4.6 6.2  NEUTROABS -- 4.0  HGB 10.8* 15.3*  HCT 31.5* 42.6  MCV 84.9 81.8  PLT 192 286   Cardiac Enzymes: No results found for this basename: CKTOTAL:5,CKMB:5,CKMBINDEX:5,TROPONINI:5 in the last 168 hours BNP (last 3 results) No results found for this basename: PROBNP:3 in the last 8760 hours CBG: No results found for this basename: GLUCAP:5 in the last 168 hours  No results found for this or any previous visit (from the past 240 hour(s)).   Studies: Dg Chest 2  View  10/06/2011  *RADIOLOGY REPORT*  Clinical Data: Right-sided chest pain  CHEST - 2 VIEW  Comparison: 06/27/2007  Findings: Cardiac leads project over the chest.  Normal heart, mediastinal, and hilar contours.  Normal pulmonary vascularity. The lungs are well expanded and clear.  No acute or suspicious bony abnormality.  The trachea is midline.  IMPRESSION: No acute cardiopulmonary disease.   Original Report Authenticated By: Britta Mccreedy, M.D.    US Abdomen Complete  10/06/2011  *RADIOLOGY REPORT*  Clinical Data:  Right upper quadrant abdominal pain.  Evaluate for cholecystitis.  COMPLETE ABDOMINAL ULTRASOUND   Comparison:  Abdominal ultrasound 09/26/2011.  Findings:  Gallbladder:  No shadowing gallstones or echogenic sludge.  No gallbladder wall thickening or pericholecystic fluid.  Negative sonographic Murphy's sign according to the ultrasound technologist.  Common bile duct:  Normal caliber measuring 6 mm in the porta hepatis.  Liver:  Normal size and echotexture.  Again noted is a small echogenic region measuring approximately 1.6 x 1.0 x 1.0 cm adjacent to the falciform ligament, most compatible with focal hepatic steatosis (this was visualized on recent CT scan 09/29/2011).  Patent portal vein with hepatopetal flow.  IVC:  Although the pancreas is difficult to visualize in its entirety, no focal pancreatic abnormality is identified.  Pancreas:  Normal size and echotexture without focal parenchymal abnormality.7.5 cm in length.  Spleen:  No hydronephrosis.  Well-preserved cortex.  Normal size and parenchymal echotexture without focal abnormalities. 10.3 cm in length.  Right Kidney:  No hydronephrosis.  Well-preserved cortex.  Normal size and parenchymal echotexture without focal abnormalities. 10.1 cm in length.  Left Kidney:  Measures up to 1.6 cm in diameter proximally, and tapers appropriately distally.  Abdominal aorta:  No aneurysm identified.  IMPRESSION: 1.  No acute findings in the abdomen to account for the patient's symptoms.  Specifically, no evidence of cholelithiasis or findings to suggest acute cholecystitis. 2.  Small focal area of fatty infiltration in the liver adjacent to the falciform ligament is unchanged compared to the prior examination, has been confirmed on interval CT of the abdomen and pelvis 09/29/2011, and is a benign finding requiring no specific imaging follow-up.   Original Report Authenticated By: Florencia Reasons, M.D.     Scheduled Meds:   . sodium chloride   Intravenous STAT  . ondansetron (ZOFRAN) IV  4 mg Intravenous Once  . pantoprazole (PROTONIX) IV  40 mg Intravenous  Q24H  . potassium chloride SA  40 mEq Oral Once  . potassium chloride SA  40 mEq Oral Once  . sodium chloride  1,000 mL Intravenous Once  . sucralfate  1 g Oral QID  . DISCONTD: potassium chloride  10 mEq Intravenous Q1 Hr x 3  . DISCONTD: potassium chloride SA  40 mEq Oral Once   Continuous Infusions:   . 0.9 % NaCl with KCl 20 mEq / L 100 mL/hr at 10/07/11 1610    Principal Problem:  *RUQ pain Active Problems:  Biliary colic  Nausea and vomiting  Weight loss    Time spent: 35 minutes   [facility]  [facility] Pager [phone]. If 8PM-8AM, please contact night-coverage at [email or web address], password [facility] 10/07/2011, 12:52 PM  LOS: 1 day

## 2011-10-07 NOTE — Progress Notes (Signed)
HIDA will be done during the week ejection fraction is not done on the weekend. Monike Bragdon RN

## 2011-10-08 ENCOUNTER — Encounter (HOSPITAL_COMMUNITY): Payer: Self-pay | Admitting: General Surgery

## 2011-10-08 ENCOUNTER — Observation Stay (HOSPITAL_COMMUNITY): Payer: Managed Care, Other (non HMO)

## 2011-10-08 DIAGNOSIS — R109 Unspecified abdominal pain: Secondary | ICD-10-CM

## 2011-10-08 DIAGNOSIS — R1011 Right upper quadrant pain: Principal | ICD-10-CM

## 2011-10-08 LAB — COMPREHENSIVE METABOLIC PANEL
AST: 11 U/L (ref 0–37)
BUN: <3 mg/dL — ABNORMAL LOW (ref 6–23)
CO2: 26 mEq/L (ref 19–32)
Calcium: 8.1 mg/dL — ABNORMAL LOW (ref 8.4–10.5)
Chloride: 105 mEq/L (ref 96–112)
Creatinine, Ser: 0.72 mg/dL (ref 0.50–1.10)
GFR calc Af Amer: >90 mL/min (ref >90)
GFR calc non Af Amer: >90 mL/min (ref >90)
Glucose, Bld: 85 mg/dL (ref 70–99)
Total Bilirubin: 0.1 mg/dL — ABNORMAL LOW (ref 0.3–1.2)

## 2011-10-08 MED ORDER — TECHNETIUM TC 99M MEBROFENIN IV KIT: 5.5000 | PACK | Freq: Once | INTRAVENOUS | Status: AC | PRN

## 2011-10-08 MED ORDER — TECHNETIUM TC 99M MEBROFENIN IV KIT
5.5000 | PACK | Freq: Once | INTRAVENOUS | Status: AC | PRN
  Administered 2011-10-08: 6 via INTRAVENOUS

## 2011-10-08 MED ORDER — POTASSIUM CHLORIDE CRYS ER 20 MEQ PO TBCR
40.0000 meq | EXTENDED_RELEASE_TABLET | Freq: Once | ORAL | Status: AC
  Administered 2011-10-08: 40 meq via ORAL
  Filled 2011-10-08: qty 2

## 2011-10-08 NOTE — Progress Notes (Signed)
[facility] PROGRESS NOTE  Brenda Warren YNW:[id number] DOB: 1990/08/15 DOA: 10/06/2011 PCP: Emeterio Reeve, MD  Assessment/Plan: Principal Problem:  *RUQ pain Active Problems:  Biliary colic  Nausea and vomiting  Weight loss   RUQ abdominal pain  -Unclear etiology, clinically resembles biliary colic, no evidence of acute or chronic cholecystitis. -Symptomatic management with pain control and antiemetics. -HIDA scan with patent cystic duct and normal ejection fraction, general surgery to evaluate.  Nausea and vomiting  -This is likely secondary to her gallbladder disease.   Hypokalemia  -Secondary to vomiting, repleted orally.  Weight loss  -She lost over 20 pounds of her weight in the past 3 weeks. But somehow she kept her hydration, she does not have any biochemical evidence of prerenal azotemia or acute kidney injury.    Code Status: Full Family Communication: Father at bedside Disposition Plan: Likely home when she is medically stable   Brief narrative: 21 year old Caucasian female with past medical history of anxiety/depression came into the hospital complaining about colicky RUQ abdominal pain.  Consultants:  None  Procedures:  None  Antibiotics:  None  HPI/Subjective: Vomited once since last night, RUQ pain comes and goes  Objective: Filed Vitals:   10/07/11 0545 10/07/11 1400 10/07/11 2052 10/08/11 0450  BP: 104/69 106/70 119/80 113/73  Pulse: 63 62 58 62  Temp: 98.2 F (36.8 C) 97.8 F (36.6 C) 98.2 F (36.8 C) 97.5 F (36.4 C)  TempSrc: Oral Oral Oral Oral  Resp: 16 16 16 18   Height:      Weight:      SpO2: 98% 98% 99% 99%    Intake/Output Summary (Last 24 hours) at 10/08/11 1351 Last data filed at 10/08/11 1000  Gross per 24 hour  Intake 1751.67 ml  Output   2450 ml  Net -698.33 ml   Filed Weights   10/06/11 1702  Weight: 51.71 kg (114 lb)    Exam: General: Alert and awake, oriented x3, not in any acute  distress. HEENT: anicteric sclera, pupils reactive to light and accommodation, EOMI CVS: S1-S2 clear, no murmur rubs or gallops Chest: clear to auscultation bilaterally, no wheezing, rales or rhonchi Abdomen: soft , RUQ tenderness Extremities: no cyanosis, clubbing or edema noted bilaterally Neuro: Cranial nerves II-XII intact, no focal neurological deficits  Data Reviewed: Basic Metabolic Panel:  Lab 10/08/11 6578 10/07/11 0432 10/06/11 1125  NA 137 138 136  K 3.6 3.7 3.0*  CL 105 110 96  CO2 26 24 26   GLUCOSE 85 84 98  BUN <3* 6 11  CREATININE 0.72 0.77 0.82  CALCIUM 8.1* 8.0* 9.7  MG -- -- --  PHOS -- -- --   Liver Function Tests:  Lab 10/08/11 0403 10/07/11 0432 10/06/11 1125  AST 11 11 15   ALT 8 8 12   ALKPHOS 44 43 70  BILITOT 0.1* 0.2* 0.4  PROT 4.9* 4.9* 8.2  ALBUMIN 2.8* 2.9* 4.9    Lab 10/06/11 1125  LIPASE 25  AMYLASE --   No results found for this basename: AMMONIA:5 in the last 168 hours CBC:  Lab 10/07/11 0432 10/06/11 1125  WBC 4.6 6.2  NEUTROABS -- 4.0  HGB 10.8* 15.3*  HCT 31.5* 42.6  MCV 84.9 81.8  PLT 192 286   Cardiac Enzymes: No results found for this basename: CKTOTAL:5,CKMB:5,CKMBINDEX:5,TROPONINI:5 in the last 168 hours BNP (last 3 results) No results found for this basename: PROBNP:3 in the last 8760 hours CBG: No results found for this basename: GLUCAP:5 in the last 168 hours  No results found for this or any previous visit (from the past 240 hour(s)).   Studies: Nm Hepato W/eject Fract  10/08/2011  *RADIOLOGY REPORT*  Clinical Data:  Right upper quadrant pain  NUCLEAR MEDICINE HEPATOBILIARY IMAGING WITH GALLBLADDER EF  Technique:  Sequential images of the abdomen were obtained out to 60 minutes following intravenous administration of radiopharmaceutical. After oral ingestion of 8 ounces of ensure plus, gallbladder ejection fraction was determined.  Radiopharmaceutical:  5.5 mCi Tc-28m Choletec  Comparison:  None.  Findings:  Gallbladder activity occurs after 10 minutes.  Small bowel activity occurs after 20 minutes. Gallbladder ejection fraction is 85% after 46 minutes.  The patient did experience symptoms after oral ingestion of Half- and-Half cream. This consisted of abdominal pain.  IMPRESSION: Cystic and common bile ducts are patent.  Gallbladder ejection fraction is within normal limits.   Original Report Authenticated By: Donavan Burnet, M.D.     Scheduled Meds:    . sodium chloride   Intravenous STAT  . pantoprazole (PROTONIX) IV  40 mg Intravenous Q24H  . potassium chloride  40 mEq Oral Once  . sucralfate  1 g Oral QID   Continuous Infusions:    . 0.9 % NaCl with KCl 20 mEq / L 100 mL/hr at 10/08/11 0600    Principal Problem:  *RUQ pain Active Problems:  Biliary colic  Nausea and vomiting  Weight loss    Time spent: 35 minutes   [facility]  [facility] Pager [phone]. If 8PM-8AM, please contact night-coverage at [email or web address], password [facility] 10/08/2011, 1:51 PM  LOS: 2 days

## 2011-10-08 NOTE — Progress Notes (Signed)
INITIAL ADULT NUTRITION ASSESSMENT Date: 10/08/2011   Time: 3:27 PM Reason for Assessment: Nutrition risk   INTERVENTION: Diet advancement per MD. Will monitor.  Pt meets criteria for severe PCM of acute illness AEB <50% estimated energy intake for the past 2 weeks with reported 14.9% weight loss.   ASSESSMENT: Female 21 y.o.  Dx: RUQ pain  Food/Nutrition Related Hx: Pt with very poor intake for the past 2 weeks r/t nausea/vomiting, sometimes 8-10 episodes of emesis/day. Pt reports it could take her 3 days to consume 1 can of chicken noodle soup and she was consuming ramen noodle broth with a few noodles, but overall very minimal intake as when pt would eat/drink most anything she would vomit. Pt reports the emesis was green in color and foamy. Pt denies eating anything out of the ordinary during this time. Pt reports 20 pound unintended weight loss in the past 2 weeks. Pt reports having RUQ pain with spasms when she eats. Pt states she has seen a gastroenterologist in the past but they were a "quack" doctor who did not help pt per pt report. Pt states she has not vomited in 2 days. Pt states she was given Ensure earlier in admission which pt vomited up. Pt reports she has been tolerating full liquid diet well. Noted pt with multiple food allergies which have been recorded and manager's check ordered for pt's trays. Per PA notes pt's ultrasound was normal with no gallstones and a HIDA that was also normal with normal EF of 85%.   Hx:  Past Medical History  Diagnosis Date  . Anxiety   . Depression   . Ovarian cyst   . Asthma   . Migraines    Related Meds:  Scheduled Meds:   . pantoprazole (PROTONIX) IV  40 mg Intravenous Q24H  . potassium chloride  40 mEq Oral Once  . sucralfate  1 g Oral QID   Continuous Infusions:   . 0.9 % NaCl with KCl 20 mEq / L 100 mL/hr at 10/08/11 1521   PRN Meds:.acetaminophen, diphenhydrAMINE, HYDROmorphone (DILAUDID) injection, hyoscyamine, ondansetron  (ZOFRAN) IV, ondansetron, technetium TC 59M mebrofenin, technetium TC 59M mebrofenin  Ht: 5' 2.5" (158.8 cm)  Wt: 114 lb (51.71 kg)  Ideal Wt: 110 lb % Ideal Wt: 104  Usual Wt: 134 lb per pt report % Usual Wt: 85  Body mass index is 20.52 kg/(m^2). Between 25-50th percentile for BMI-for-age   Labs:  CMP     Component Value Date/Time   NA 137 10/08/2011 0403   K 3.6 10/08/2011 0403   CL 105 10/08/2011 0403   CO2 26 10/08/2011 0403   GLUCOSE 85 10/08/2011 0403   BUN <3* 10/08/2011 0403   CREATININE 0.72 10/08/2011 0403   CALCIUM 8.1* 10/08/2011 0403   PROT 4.9* 10/08/2011 0403   ALBUMIN 2.8* 10/08/2011 0403   AST 11 10/08/2011 0403   ALT 8 10/08/2011 0403   ALKPHOS 44 10/08/2011 0403   BILITOT 0.1* 10/08/2011 0403   GFRNONAA >90 10/08/2011 0403   GFRAA >90 10/08/2011 0403    Intake/Output Summary (Last 24 hours) at 10/08/11 1535 Last data filed at 10/08/11 1400  Gross per 24 hour  Intake 1351.67 ml  Output   2750 ml  Net -1398.33 ml   Last BM - 9/4  Diet Order:  No diet currently entered.    IVF:    0.9 % NaCl with KCl 20 mEq / L Last Rate: 100 mL/hr at 10/08/11 1521    Estimated  Nutritional Needs:   Kcal:1550-1850 Protein:65-75g Fluid:1.5-1.8L  NUTRITION DIAGNOSIS: -Inadequate oral intake (NI-2.1).  Status: Ongoing  RELATED TO: nausea/vomiting  AS EVIDENCE BY: pt statement, significant reported weight loss  MONITORING/EVALUATION(Goals): 1. Resolution of nausea/vomiting 2. Advance diet as tolerated to bland diet  EDUCATION NEEDS: -No education needs identified at this time   Dietitian #: [phone]  DOCUMENTATION CODES Per approved criteria  -Severe malnutrition in the context of acute illness or injury    Marshall Cork 10/08/2011, 3:27 PM

## 2011-10-08 NOTE — Consult Note (Signed)
Brenda Warren 04/12/1990  [id number].   Requesting MD: Dr. Clydia Llano Chief Complaint/Reason for Consult: RUQ abdominal pain HPI: This is a 21 yo female who for the last 2-3 weeks has developed RUQ abdominal pain with nausea and vomiting.  She states that she is nauseous all the time, but when she eats or drinks she throws up.  She has had some diarrhea, particularly in the mornings when she was able to eat, but has not had any over the last 2 weeks since she hasn't eaten much.  She has been seen in the ED with a CT scan that was normal.  She was referred to Dr. Madilyn Fireman with [facility] who did an upper endoscopy and by patient report (no report in epic) this was normal.  Apparently something was seen in her esophagus and this was biopsied.  These results are pending.  This was done last Friday.  Specifically, no evidence of ulcers were seen.  The patient came back to the ED and was admitted due to continued pain, nausea, and vomiting.  She does report a 20 lb weight loss.  She had an u/s that was normal with no gallstones and a HIDA that was also normal with a normal EF of 85%.  We have been asked to see for further recommendations.  Review of Systems: Please see HPI, otherwise all other systems have been reviewed and are negative.  Family History  Problem Relation Age of Onset  . Gallbladder disease Father     Past Medical History  Diagnosis Date  . Anxiety   . Depression   . Ovarian cyst   . Asthma   . Migraines     PSH: Knee scope  Social History:  reports that she has never smoked. She does not have any smokeless tobacco history on file. She reports that she does not drink alcohol or use illicit drugs.  Allergies:  Allergies  Allergen Reactions  . Banana Anaphylaxis  . Orange Concentrate Information systems manager) Anaphylaxis  . Other Itching    Almonds, walnuts, and raw carrots  . Peach (Prunus Persica) Anaphylaxis  . Tomato Anaphylaxis    Raw tomato  . Watermelon Concentrate  (Citrullus Vulgaris) Anaphylaxis    Also cantaloupe  . Darvocet (Propoxyphene-Acetaminophen) Itching  . Milk-Related Compounds Diarrhea    Ok with yogurt and cheese    Medications Prior to Admission  Medication Sig Dispense Refill  . acetaminophen (TYLENOL) 500 MG tablet Take 1,000 mg by mouth every 6 (six) hours as needed.      . ALPRAZolam (XANAX) 0.25 MG tablet Take 0.125-0.25 mg by mouth at bedtime as needed. For anxiety      . EPINEPHrine (EPI-PEN) 0.3 mg/0.3 mL DEVI Inject 0.3 mg into the muscle once.      . escitalopram (LEXAPRO) 10 MG tablet Take 10 mg by mouth at bedtime.      . hyoscyamine (LEVSIN SL) 0.125 MG SL tablet Place 0.125 mg under the tongue every 4 (four) hours as needed. For stomach pain      . levocetirizine (XYZAL) 5 MG tablet Take 5 mg by mouth every evening.      . ondansetron (ZOFRAN) 4 MG tablet Take 1 tablet (4 mg total) by mouth every 6 (six) hours.  12 tablet  0  . pantoprazole (PROTONIX) 40 MG tablet Take 1 tablet (40 mg total) by mouth daily.  30 tablet  0  . promethazine (PHENERGAN) 25 MG tablet Take 25 mg by mouth every 6 (six) hours  as needed. For nausea      . sucralfate (CARAFATE) 1 GM/10ML suspension Take 1 g by mouth 4 (four) times daily.        Blood pressure 112/63, pulse 58, temperature 98.1 F (36.7 C), temperature source Oral, resp. rate 18, height 5' 2.5" (1.588 m), weight 114 lb (51.71 kg), last menstrual period 09/25/2011, SpO2 100.00%. Physical Exam: General: pleasant, WD, WN white female who is laying in bed in NAD HEENT: head is normocephalic, atraumatic.  Sclera are noninjected.  PERRL.  Ears and nose without any masses or lesions.  Mouth is pink and moist Heart: regular, rate, and rhythm.  Normal s1,s2. No obvious murmurs, gallops, or rubs noted.  Palpable radial and pedal pulses bilaterally Lungs: CTAB, no wheezes, rhonchi, or rales noted.  Respiratory effort nonlabored Abd: soft, mildly tender in RUQ, ND, +BS, no masses, hernias, or  organomegaly MS: all 4 extremities are symmetrical with no cyanosis, clubbing, or edema. Skin: warm and dry with no masses, lesions, or rashes Psych: A&Ox3 with an appropriate affect.    Results for orders placed during the hospital encounter of 10/06/11 (from the past 48 hour(s))  COMPREHENSIVE METABOLIC PANEL     Status: Abnormal   Collection Time   10/07/11  4:32 AM      Component Value Range Comment   Sodium 138  135 - 145 mEq/L    Potassium 3.7  3.5 - 5.1 mEq/L    Chloride 110  96 - 112 mEq/L    CO2 24  19 - 32 mEq/L    Glucose, Bld 84  70 - 99 mg/dL    BUN 6  6 - 23 mg/dL    Creatinine, Ser 4.09  0.50 - 1.10 mg/dL    Calcium 8.0 (*) 8.4 - 10.5 mg/dL    Total Protein 4.9 (*) 6.0 - 8.3 g/dL    Albumin 2.9 (*) 3.5 - 5.2 g/dL    AST 11  0 - 37 U/L    ALT 8  0 - 35 U/L    Alkaline Phosphatase 43  39 - 117 U/L    Total Bilirubin 0.2 (*) 0.3 - 1.2 mg/dL    GFR calc non Af Amer >90  >90 mL/min    GFR calc Af Amer >90  >90 mL/min   CBC     Status: Abnormal   Collection Time   10/07/11  4:32 AM      Component Value Range Comment   WBC 4.6  4.0 - 10.5 K/uL    RBC 3.71 (*) 3.87 - 5.11 MIL/uL    Hemoglobin 10.8 (*) 12.0 - 15.0 g/dL    HCT 81.1 (*) 91.4 - 46.0 %    MCV 84.9  78.0 - 100.0 fL    MCH 29.1  26.0 - 34.0 pg    MCHC 34.3  30.0 - 36.0 g/dL    RDW 78.2  95.6 - 21.3 %    Platelets 192  150 - 400 K/uL DELTA CHECK NOTED  COMPREHENSIVE METABOLIC PANEL     Status: Abnormal   Collection Time   10/08/11  4:03 AM      Component Value Range Comment   Sodium 137  135 - 145 mEq/L    Potassium 3.6  3.5 - 5.1 mEq/L    Chloride 105  96 - 112 mEq/L    CO2 26  19 - 32 mEq/L    Glucose, Bld 85  70 - 99 mg/dL    BUN <3 (*) 6 -  23 mg/dL REPEATED TO VERIFY   Creatinine, Ser 0.72  0.50 - 1.10 mg/dL    Calcium 8.1 (*) 8.4 - 10.5 mg/dL    Total Protein 4.9 (*) 6.0 - 8.3 g/dL    Albumin 2.8 (*) 3.5 - 5.2 g/dL    AST 11  0 - 37 U/L    ALT 8  0 - 35 U/L    Alkaline Phosphatase 44  39 - 117  U/L    Total Bilirubin 0.1 (*) 0.3 - 1.2 mg/dL    GFR calc non Af Amer >90  >90 mL/min    GFR calc Af Amer >90  >90 mL/min    Nm Hepato W/eject Fract  10/08/2011  *RADIOLOGY REPORT*  Clinical Data:  Right upper quadrant pain  NUCLEAR MEDICINE HEPATOBILIARY IMAGING WITH GALLBLADDER EF  Technique:  Sequential images of the abdomen were obtained out to 60 minutes following intravenous administration of radiopharmaceutical. After oral ingestion of 8 ounces of ensure plus, gallbladder ejection fraction was determined.  Radiopharmaceutical:  5.5 mCi Tc-57m Choletec  Comparison:  None.  Findings: Gallbladder activity occurs after 10 minutes.  Small bowel activity occurs after 20 minutes. Gallbladder ejection fraction is 85% after 46 minutes.  The patient did experience symptoms after oral ingestion of Half- and-Half cream. This consisted of abdominal pain.  IMPRESSION: Cystic and common bile ducts are patent.  Gallbladder ejection fraction is within normal limits.   Original Report Authenticated By: Donavan Burnet, M.D.        Assessment/Plan 1. RUQ abdominal pain 2. Nausea, vomiting  Plan: 1. Currently, the patient does not have any findings to suggest that her gallbladder is the source of her symptoms.  She has no evidence of gallstones or dysfunctional gallbladder.  I think the etiology of her symptoms is currently unclear.  Pursuing a cholecystectomy at this time with no evidence for such, is not the first solution as this may not help any of her symptoms.  I have discussed this with the patient and her father.    Navika Hoopes E 10/08/2011, 3:01 PM

## 2011-10-09 ENCOUNTER — Other Ambulatory Visit (HOSPITAL_COMMUNITY): Payer: Private Health Insurance - Indemnity

## 2011-10-09 LAB — COMPREHENSIVE METABOLIC PANEL
ALT: 8 U/L (ref 0–35)
AST: 11 U/L (ref 0–37)
Alkaline Phosphatase: 47 U/L (ref 39–117)
CO2: 27 mEq/L (ref 19–32)
Calcium: 8.6 mg/dL (ref 8.4–10.5)
Chloride: 105 mEq/L (ref 96–112)
GFR calc Af Amer: >90 mL/min (ref >90)
GFR calc non Af Amer: >90 mL/min (ref >90)
Glucose, Bld: 94 mg/dL (ref 70–99)
Potassium: 3.7 mEq/L (ref 3.5–5.1)
Sodium: 138 mEq/L (ref 135–145)

## 2011-10-09 LAB — URINE CULTURE: Colony Count: 3000

## 2011-10-09 LAB — CBC
Hemoglobin: 11.3 g/dL — ABNORMAL LOW (ref 12.0–15.0)
Platelets: 191 10*3/uL (ref 150–400)
RBC: 3.88 MIL/uL (ref 3.87–5.11)
WBC: 5.2 10*3/uL (ref 4.0–10.5)

## 2011-10-09 MED ORDER — PROMETHAZINE HCL 25 MG PO TABS: 25.0000 mg | ORAL_TABLET | Freq: Four times a day (QID) | ORAL | Status: DC | PRN

## 2011-10-09 MED ORDER — OXYCODONE HCL 5 MG PO CAPS: 5.0000 mg | ORAL_CAPSULE | ORAL | Status: AC | PRN

## 2011-10-09 NOTE — Progress Notes (Signed)
Per discussion with pt, pt does not have a dairy allergy, only an intolerance to certain milk products which pt is very aware of.

## 2011-10-09 NOTE — Progress Notes (Signed)
Patient ID: Brenda Warren, female   DOB: Sep 02, 1990, 21 y.o.   MRN: [id number]    Subjective: Pt feels ok today, just woke up.  Did take nausea medicine last night.  Objective: Vital signs in last 24 hours: Temp:  [97.9 F (36.6 C)-98.8 F (37.1 C)] 98.4 F (36.9 C) (09/10 0615) Pulse Rate:  [54-65] 57  (09/10 0615) Resp:  [16-18] 16  (09/10 0615) BP: (100-117)/(63-75) 100/67 mmHg (09/10 0615) SpO2:  [99 %-100 %] 100 % (09/10 0615) Last BM Date: 10/03/11  Intake/Output from previous day: 09/09 0701 - 09/10 0700 In: 3720 [P.O.:1320; I.V.:2400] Out: 5250 [Urine:5250] Intake/Output this shift:    PE: Abd: soft, tender this morning in RLQ, no RUQ tenderness this morning.  +BS, ND  Lab Results:   Basename 10/09/11 0427 10/07/11 0432  WBC 5.2 4.6  HGB 11.3* 10.8*  HCT 32.7* 31.5*  PLT 191 192   BMET  Basename 10/09/11 0427 10/08/11 0403  NA 138 137  K 3.7 3.6  CL 105 105  CO2 27 26  GLUCOSE 94 85  BUN <3* <3*  CREATININE 0.72 0.72  CALCIUM 8.6 8.1*   PT/INR No results found for this basename: LABPROT:2,INR:2 in the last 72 hours CMP     Component Value Date/Time   NA 138 10/09/2011 0427   K 3.7 10/09/2011 0427   CL 105 10/09/2011 0427   CO2 27 10/09/2011 0427   GLUCOSE 94 10/09/2011 0427   BUN <3* 10/09/2011 0427   CREATININE 0.72 10/09/2011 0427   CALCIUM 8.6 10/09/2011 0427   PROT 5.4* 10/09/2011 0427   ALBUMIN 3.2* 10/09/2011 0427   AST 11 10/09/2011 0427   ALT 8 10/09/2011 0427   ALKPHOS 47 10/09/2011 0427   BILITOT 0.1* 10/09/2011 0427   GFRNONAA >90 10/09/2011 0427   GFRAA >90 10/09/2011 0427   Lipase     Component Value Date/Time   LIPASE 25 10/06/2011 1125       Studies/Results: Nm Hepato W/eject Fract  10/08/2011  *RADIOLOGY REPORT*  Clinical Data:  Right upper quadrant pain  NUCLEAR MEDICINE HEPATOBILIARY IMAGING WITH GALLBLADDER EF  Technique:  Sequential images of the abdomen were obtained out to 60 minutes following intravenous administration of  radiopharmaceutical. After oral ingestion of 8 ounces of ensure plus, gallbladder ejection fraction was determined.  Radiopharmaceutical:  5.5 mCi Tc-10m Choletec  Comparison:  None.  Findings: Gallbladder activity occurs after 10 minutes.  Small bowel activity occurs after 20 minutes. Gallbladder ejection fraction is 85% after 46 minutes.  The patient did experience symptoms after oral ingestion of Half- and-Half cream. This consisted of abdominal pain.  IMPRESSION: Cystic and common bile ducts are patent.  Gallbladder ejection fraction is within normal limits.   Original Report Authenticated By: Donavan Burnet, M.D.     Anti-infectives: Anti-infectives    None       Assessment/Plan  1. Abdominal pain 2. N/V  Plan: 1. There is no clear etiology of the patient's abdominal pain, nausea, and vomiting.  All of her gallbladder studies are normal.  She complains of emesis as soon as food hits her stomach.  This is not c/w gallbladder disease.  I have d/w Dr. Dwain Sarna and would recommend the GI guys coming back to evaluate her.  She has recently had an EGD that was negative, but likely needs further workup to rule out other possible causes of her symptoms. 2. No indications currently to remove her gallbladder.  If all testing is negative and patient  continues with progressive symptoms, then Dr. Dwain Sarna would be happy to see her in the office to consider an elective cholecystectomy in the future.  LOS: 3 days    Brenda Warren 10/09/2011

## 2011-10-09 NOTE — Progress Notes (Addendum)
I agree with above.  I am happy to see in her my office to evaluate her if remaining gi workup is negative. I think reasonable for trial of med mgt for nausea, ? Gastroparesis for some reason, ? Food allergy evaluation in relation to this. I do not think this will cure her symptoms and think that medical mgt is the best plan for now.

## 2011-10-09 NOTE — Progress Notes (Signed)
Patient's mother wanted to speak with management. I spoke with patient and mother and father. They wanted to express their concerns in the treatment of their daughter. I listen to their concerns. They would like a copy of all their records. I gave them the medical release form. Took the form down to medical records for them . They wanted to leave and before leaving wanted to speak with the MD. I paged MD and made him aware of their request.

## 2011-10-09 NOTE — Discharge Summary (Signed)
Physician Discharge Summary  Meggan Dhaliwal ZHY:[id number] DOB: Jun 06, 1990 DOA: 10/06/2011  PCP: Emeterio Reeve, MD  Admit date: 10/06/2011 Discharge date: 10/09/2011  Recommendations for Outpatient Follow-up:  1. Followup with primary care physician.  Discharge Diagnoses:  Principal Problem:  *RUQ pain Active Problems:  Nausea and vomiting  Weight loss   Discharge Condition: Fair  Diet recommendation: Regular diet  Filed Weights   10/06/11 1702  Weight: 51.71 kg (114 lb)    History of present illness:  Brenda Warren is a 21 y.o. female with past medical history of anxiety/depression and migraines came in to the hospital complaining about RUQ abdominal pain. Patient started that about 2 weeks ago with nausea and vomiting, she was seen by her primary care physician and it was thought initially to be secondary to viral gastroenteritis. Patient was treated symptomatically but her symptoms persist, she came into the emergency department again for evaluation about a week ago, and she was referred to a gastroenterologist. She did have upper GI endoscopy done yesterday, and she was been told it was okay. Patient still complaining about intractable nausea and vomiting. She does have colicky RUQ abdominal pain. The pain is 3-5/10, pain worsened to 10/10 when she eats. Patient still complaining about pain, nausea/vomiting. She reported loss of 20 pounds over the past 3 weeks. Patient admitted to the hospital for further evaluation. She denies any fever, on initial evaluation in the emergency department today abdominal ultrasound was done and showed no evidence of gallstones.   Hospital Course:    1. RUQ abdominal pain: Of unclear etiology, this is clinically resemble biliary colic. But there is no evidence of acute or chronic cholecystitis, ultrasound, CT scan and HIDA scan did not show any biliary abnormalities. Patient mentioned that she had problem with GERD from before but she did have EEG  done the day prior to admission and she reported it was clean and did not show any ulceration or evidence of complications or related to GERD. Throughout the hospital stay patient was still complaining about nausea/vomiting. I consulted [facility], Emelia Loron was kind enough to evaluate the patient, and he recommended for medical management that there is no clear indication for cholecystectomy. Knowing that patient was evaluated by GI just one day prior to her admission I discussed with her about the limitation of other radiological modalities of workup. I pressed on the fact that she needs to continue taking her Protonix, and I indicated that patient can see gastroenterology as outpatient. I've been called later by the nursing staff that the patient's family including her mother, father and her maternal grandmother wanted to talk to me. I have prolonged meeting with the family, explained to them with the findings so far and what is the Gen. surgery recommendation regarding consulting GI. Family initially was agreeable to stay in the hospital and obtaining GI consult (family and patient do NOT want to see Dr. Christiana Pellant again) but patient indicates that she does not want to stay in the hospital for any further workup.  I explained to her that she needs to followup with her primary care physician, she needs to see GI specialist as outpatient for further workup. Prescriptions for 20 pills of OxyIR 5 mg were given, also I refilled her Phenergan of 25 mg 30 pills given.  2. Weight loss: Patient reported 20 pound of weight loss, she said her usual weight is about 130-135 pounds and when she can get in to the hospital her weight was 115  pounds. This is likely secondary to her nausea and vomiting and loss of appetite because of the RUQ pain.  3. Nausea and vomiting: Secondary to #1, patient also mentioning anorexia. Patient said she lost about 20 pounds and somehow she kept her hydration, she does  not have any biochemical evidence of acute kidney injury or even prerenal azotemia at the time of admission.  4. Hypokalemia: Patient was present with hypokalemia likely secondary to vomiting, this was repleted orally and parenterally.  Procedures:  None  Consultations:  Emelia Loron of [facility]  Discharge Exam: Filed Vitals:   10/08/11 2203 10/09/11 0206 10/09/11 0615 10/09/11 1400  BP: 117/75 111/69 100/67 122/78  Pulse: 65 54 57 65  Temp: 97.9 F (36.6 C) 98.8 F (37.1 C) 98.4 F (36.9 C) 98 F (36.7 C)  TempSrc: Oral Oral Oral Oral  Resp: 18 18 16 18   Height:      Weight:      SpO2: 99% 100% 100% 100%   General: Alert and awake, oriented x3, not in any acute distress. HEENT: anicteric sclera, pupils reactive to light and accommodation, EOMI CVS: S1-S2 clear, no murmur rubs or gallops Chest: clear to auscultation bilaterally, no wheezing, rales or rhonchi Abdomen: soft, mild RUQ tenderness, nondistended, normal bowel sounds, no organomegaly Extremities: no cyanosis, clubbing or edema noted bilaterally Neuro: Cranial nerves II-XII intact, no focal neurological deficits  Discharge Instructions  Discharge Orders    Future Orders Please Complete By Expires   Increase activity slowly        Medication List  As of 10/09/2011  4:20 PM   STOP taking these medications         ondansetron 4 MG tablet         TAKE these medications         acetaminophen 500 MG tablet   Commonly known as: TYLENOL   Take 1,000 mg by mouth every 6 (six) hours as needed.      ALPRAZolam 0.25 MG tablet   Commonly known as: XANAX   Take 0.125-0.25 mg by mouth at bedtime as needed. For anxiety      EPINEPHrine 0.3 mg/0.3 mL Devi   Commonly known as: EPI-PEN   Inject 0.3 mg into the muscle once.      escitalopram 10 MG tablet   Commonly known as: LEXAPRO   Take 10 mg by mouth at bedtime.      hyoscyamine 0.125 MG SL tablet   Commonly known as: LEVSIN SL   Place  0.125 mg under the tongue every 4 (four) hours as needed. For stomach pain      levocetirizine 5 MG tablet   Commonly known as: XYZAL   Take 5 mg by mouth every evening.      oxycodone 5 MG capsule   Commonly known as: OXY-IR   Take 1 capsule (5 mg total) by mouth every 4 (four) hours as needed.      pantoprazole 40 MG tablet   Commonly known as: PROTONIX   Take 1 tablet (40 mg total) by mouth daily.      promethazine 25 MG tablet   Commonly known as: PHENERGAN   Take 1 tablet (25 mg total) by mouth every 6 (six) hours as needed. For nausea      sucralfate 1 GM/10ML suspension   Commonly known as: CARAFATE   Take 1 g by mouth 4 (four) times daily.           Follow-up Information  Follow up with Emeterio Reeve, MD in 1 week.   Contact information:   [address] [place] [address] [phone]           The results of significant diagnostics from this hospitalization (including imaging, microbiology, ancillary and laboratory) are listed below for reference.    Significant Diagnostic Studies: Ct Abdomen Pelvis Wo Contrast  09/29/2011  *RADIOLOGY REPORT*  Clinical Data: Abdominal pain  CT ABDOMEN AND PELVIS WITHOUT CONTRAST  Technique:  Multidetector CT imaging of the abdomen and pelvis was performed following the standard protocol without intravenous contrast.  Comparison: None  Findings:  Lung bases are within normal limits.  No pericardial or pleural effusion.  There is no focal liver abnormality.  The gallbladder appears normal.  There is no biliary dilatation.  Pancreas appears normal. The spleen is unremarkable.  Both adrenal glands appear normal.  Normal appearance of the right kidney.  The left kidney is also normal.  No nephrolithiasis or hydronephrosis.  No hydroureter.  Urinary bladder is collapsed and difficult to assess.  The uterus and the adnexal structures have a normal appearance.  No upper abdominal adenopathy.  No pelvic or  inguinal adenopathy.  There is no free fluid identified within the abdomen or in the pelvis.  The stomach appears normal.  The small bowel loops are unremarkable. The appendix is visualized and appears normal.  Normal appearance of the colon.  The visualized osseous structures are unremarkable.  IMPRESSION:  1.  No acute findings within the abdomen or pelvis.   Original Report Authenticated By: Rosealee Albee, M.D.    Dg Chest 2 View  10/06/2011  *RADIOLOGY REPORT*  Clinical Data: Right-sided chest pain  CHEST - 2 VIEW  Comparison: 06/27/2007  Findings: Cardiac leads project over the chest.  Normal heart, mediastinal, and hilar contours.  Normal pulmonary vascularity. The lungs are well expanded and clear.  No acute or suspicious bony abnormality.  The trachea is midline.  IMPRESSION: No acute cardiopulmonary disease.   Original Report Authenticated By: Britta Mccreedy, M.D.    US Abdomen Complete  10/06/2011  *RADIOLOGY REPORT*  Clinical Data:  Right upper quadrant abdominal pain.  Evaluate for cholecystitis.  COMPLETE ABDOMINAL ULTRASOUND  Comparison:  Abdominal ultrasound 09/26/2011.  Findings:  Gallbladder:  No shadowing gallstones or echogenic sludge.  No gallbladder wall thickening or pericholecystic fluid.  Negative sonographic Murphy's sign according to the ultrasound technologist.  Common bile duct:  Normal caliber measuring 6 mm in the porta hepatis.  Liver:  Normal size and echotexture.  Again noted is a small echogenic region measuring approximately 1.6 x 1.0 x 1.0 cm adjacent to the falciform ligament, most compatible with focal hepatic steatosis (this was visualized on recent CT scan 09/29/2011).  Patent portal vein with hepatopetal flow.  IVC:  Although the pancreas is difficult to visualize in its entirety, no focal pancreatic abnormality is identified.  Pancreas:  Normal size and echotexture without focal parenchymal abnormality.7.5 cm in length.  Spleen:  No hydronephrosis.  Well-preserved  cortex.  Normal size and parenchymal echotexture without focal abnormalities. 10.3 cm in length.  Right Kidney:  No hydronephrosis.  Well-preserved cortex.  Normal size and parenchymal echotexture without focal abnormalities. 10.1 cm in length.  Left Kidney:  Measures up to 1.6 cm in diameter proximally, and tapers appropriately distally.  Abdominal aorta:  No aneurysm identified.  IMPRESSION: 1.  No acute findings in the abdomen to account for the patient's symptoms.  Specifically, no evidence of  cholelithiasis or findings to suggest acute cholecystitis. 2.  Small focal area of fatty infiltration in the liver adjacent to the falciform ligament is unchanged compared to the prior examination, has been confirmed on interval CT of the abdomen and pelvis 09/29/2011, and is a benign finding requiring no specific imaging follow-up.   Original Report Authenticated By: Florencia Reasons, M.D.    US Abdomen Complete  09/26/2011  *RADIOLOGY REPORT*  Clinical Data:  Evaluate for cholecystitis, right upper quadrant pain  ABDOMINAL ULTRASOUND COMPLETE  Comparison:  None.  Findings:  Gallbladder:  No gallstones, gallbladder wall thickening, or pericholecystic fluid. Per the sonographer, as sonographic Murphy's sign is negative.  Gallbladder wall thickness measures 1.8 mm.  Common Bile Duct:  Within normal limits in caliber at 4.3 mm  Liver: There is a focal, rounded 2.1 x 1.1 cm echogenic focus along the anterior surface of the left hepatic lobe near the fissure for the falciform ligament.  No internal vascularity on color Doppler evaluation.  Within normal limits in parenchymal echogenicity.  IVC:  Appears normal.  Pancreas:  No abnormality identified.  Spleen:  Within normal limits in size and echotexture. The spleen measures 8.4 cm in length.  Right kidney:  Normal in size and parenchymal echogenicity.  No evidence of mass or hydronephrosis.  10.1 cm in length  Left kidney:  Normal in size and parenchymal echogenicity.  No  evidence of mass or hydronephrosis.  9.9 cm in length  Abdominal Aorta:  No aneurysm identified.  IMPRESSION:  1.  No sonographic evidence of cholelithiasis, or acute cholecystitis.  2.  Rounded 2.1 x 1.1 cm echogenic focus along the anterior surface of the left hepatic lobe near the fissure for the falciform ligament.  This almost certainly reflects either focal fatty infiltration, or a benign hepatic hemangioma.  In a patient of this age, without known underlying hepatic disease a primary liver tumor would be extremely rare.  Consider follow-up ultrasound in 3 - 6 months to confirm stability.   Original Report Authenticated By: Vilma Prader    Nm Hepato W/eject Fract  10/08/2011  *RADIOLOGY REPORT*  Clinical Data:  Right upper quadrant pain  NUCLEAR MEDICINE HEPATOBILIARY IMAGING WITH GALLBLADDER EF  Technique:  Sequential images of the abdomen were obtained out to 60 minutes following intravenous administration of radiopharmaceutical. After oral ingestion of 8 ounces of ensure plus, gallbladder ejection fraction was determined.  Radiopharmaceutical:  5.5 mCi Tc-67m Choletec  Comparison:  None.  Findings: Gallbladder activity occurs after 10 minutes.  Small bowel activity occurs after 20 minutes. Gallbladder ejection fraction is 85% after 46 minutes.  The patient did experience symptoms after oral ingestion of Half- and-Half cream. This consisted of abdominal pain.  IMPRESSION: Cystic and common bile ducts are patent.  Gallbladder ejection fraction is within normal limits.   Original Report Authenticated By: Donavan Burnet, M.D.     Microbiology: No results found for this or any previous visit (from the past 240 hour(s)).   Labs: Basic Metabolic Panel:  Lab 10/09/11 9604 10/08/11 0403 10/07/11 0432 10/06/11 1125  NA 138 137 138 136  K 3.7 3.6 3.7 3.0*  CL 105 105 110 96  CO2 27 26 24 26   GLUCOSE 94 85 84 98  BUN <3* <3* 6 11  CREATININE 0.72 0.72 0.77 0.82  CALCIUM 8.6 8.1* 8.0* 9.7  MG -- -- -- --    PHOS -- -- -- --   Liver Function Tests:  Lab 10/09/11 0427 10/08/11 0403 10/07/11  4540 10/06/11 1125  AST 11 11 11 15   ALT 8 8 8 12   ALKPHOS 47 44 43 70  BILITOT 0.1* 0.1* 0.2* 0.4  PROT 5.4* 4.9* 4.9* 8.2  ALBUMIN 3.2* 2.8* 2.9* 4.9    Lab 10/06/11 1125  LIPASE 25  AMYLASE --   No results found for this basename: AMMONIA:5 in the last 168 hours CBC:  Lab 10/09/11 0427 10/07/11 0432 10/06/11 1125  WBC 5.2 4.6 6.2  NEUTROABS -- -- 4.0  HGB 11.3* 10.8* 15.3*  HCT 32.7* 31.5* 42.6  MCV 84.3 84.9 81.8  PLT 191 192 286   Cardiac Enzymes: No results found for this basename: CKTOTAL:5,CKMB:5,CKMBINDEX:5,TROPONINI:5 in the last 168 hours BNP: BNP (last 3 results) No results found for this basename: PROBNP:3 in the last 8760 hours CBG: No results found for this basename: GLUCAP:5 in the last 168 hours  Time coordinating discharge: 40 minutes  Signed:  Harshitha Fretz A  [facility] 10/09/2011, 4:20 PM

## 2011-10-09 NOTE — Consult Note (Signed)
I had long conversation with she and her father.  She has right sided ab pain to my exam.  Her history is not consistent completely with biliary disease.  She has n/v right when she eats and all of her food allergies are also of some concern to me.  Her studies are all normal.  There is a role at some point to take her gb out but I really don't think this will help and I think attempt at gi evaluation is the best plan for now.

## 2011-11-14 ENCOUNTER — Encounter (HOSPITAL_COMMUNITY)
Admission: RE | Disposition: A | Discharge status: Home / Self Care | Payer: Self-pay | Source: Ambulatory Visit | Attending: Gastroenterology

## 2011-11-14 ENCOUNTER — Ambulatory Visit (HOSPITAL_COMMUNITY)
Admission: RE | Admit: 2011-11-14 | Discharge: 2011-11-14 | Disposition: A | Discharge status: Home / Self Care | Payer: Managed Care, Other (non HMO) | Source: Ambulatory Visit | Attending: Gastroenterology | Admitting: Gastroenterology

## 2011-11-14 ENCOUNTER — Encounter (HOSPITAL_COMMUNITY): Payer: Self-pay | Admitting: Anesthesiology

## 2011-11-14 ENCOUNTER — Ambulatory Visit (HOSPITAL_COMMUNITY): Payer: Managed Care, Other (non HMO) | Admitting: Anesthesiology

## 2011-11-14 ENCOUNTER — Encounter (HOSPITAL_COMMUNITY): Payer: Self-pay | Admitting: *Deleted

## 2011-11-14 DIAGNOSIS — R1011 Right upper quadrant pain: Secondary | ICD-10-CM | POA: Insufficient documentation

## 2011-11-14 DIAGNOSIS — D130 Benign neoplasm of esophagus: Secondary | ICD-10-CM | POA: Insufficient documentation

## 2011-11-14 HISTORY — PX: EUS: SHX5427

## 2011-11-14 SURGERY — ESOPHAGEAL ENDOSCOPIC ULTRASOUND (EUS) RADIAL
Anesthesia: Monitor Anesthesia Care

## 2011-11-14 MED ORDER — LACTATED RINGERS IV SOLN
INTRAVENOUS | Status: DC
  Administered 2011-11-14: 13:00:00 via INTRAVENOUS

## 2011-11-14 MED ORDER — PROPOFOL 10 MG/ML IV EMUL
INTRAVENOUS | Status: DC | PRN
  Administered 2011-11-14: 150 ug/kg/min via INTRAVENOUS

## 2011-11-14 MED ORDER — LIDOCAINE HCL 1 % IJ SOLN
INTRAMUSCULAR | Status: DC | PRN
  Administered 2011-11-14: 50 mg via INTRADERMAL

## 2011-11-14 MED ORDER — SODIUM CHLORIDE 0.9 % IV SOLN: INTRAVENOUS | Status: DC

## 2011-11-14 MED ORDER — PROMETHAZINE HCL 25 MG/ML IJ SOLN: 6.2500 mg | INTRAMUSCULAR | Status: DC | PRN

## 2011-11-14 MED ORDER — BUTAMBEN-TETRACAINE-BENZOCAINE 2-2-14 % EX AERO
INHALATION_SPRAY | CUTANEOUS | Status: DC | PRN
  Administered 2011-11-14: 2 via TOPICAL

## 2011-11-14 MED ORDER — ONDANSETRON HCL 4 MG/2ML IJ SOLN
INTRAMUSCULAR | Status: DC | PRN
  Administered 2011-11-14: 4 mg via INTRAVENOUS

## 2011-11-14 MED ORDER — METOCLOPRAMIDE HCL 5 MG/ML IJ SOLN
INTRAMUSCULAR | Status: DC | PRN
  Administered 2011-11-14: 10 mg via INTRAVENOUS

## 2011-11-14 MED ORDER — MIDAZOLAM HCL 5 MG/5ML IJ SOLN
INTRAMUSCULAR | Status: DC | PRN
  Administered 2011-11-14: 1 mg via INTRAVENOUS
  Administered 2011-11-14: 2 mg via INTRAVENOUS

## 2011-11-14 MED ORDER — FENTANYL CITRATE 0.05 MG/ML IJ SOLN
INTRAMUSCULAR | Status: DC | PRN
  Administered 2011-11-14: 50 ug via INTRAVENOUS

## 2011-11-14 NOTE — Anesthesia Preprocedure Evaluation (Addendum)
Anesthesia Evaluation  Patient identified by MRN, date of birth, ID band Patient awake    Reviewed: Allergy & Precautions, H&P , NPO status , Patient's Chart, lab work & pertinent test results  Airway Mallampati: II TM Distance: >3 FB Neck ROM: Full    Dental  (+) Teeth Intact and Dental Advisory Given   Pulmonary neg pulmonary ROS, asthma ,  breath sounds clear to auscultation  Pulmonary exam normal       Cardiovascular negative cardio ROS  Rhythm:Regular Rate:Normal     Neuro/Psych  Headaches, Anxiety Depression negative neurological ROS  negative psych ROS   GI/Hepatic negative GI ROS, Neg liver ROS, GERD-  Medicated,  Endo/Other  negative endocrine ROS  Renal/GU negative Renal ROS  negative genitourinary   Musculoskeletal negative musculoskeletal ROS (+)   Abdominal   Peds negative pediatric ROS (+)  Hematology negative hematology ROS (+)   Anesthesia Other Findings   Reproductive/Obstetrics negative OB ROS                          Anesthesia Physical Anesthesia Plan  ASA: I  Anesthesia Plan: MAC   Post-op Pain Management:    Induction: Intravenous  Airway Management Planned: Nasal Cannula  Additional Equipment:   Intra-op Plan:   Post-operative Plan:   Informed Consent: I have reviewed the patients History and Physical, chart, labs and discussed the procedure including the risks, benefits and alternatives for the proposed anesthesia with the patient or authorized representative who has indicated his/her understanding and acceptance.   Dental advisory given  Plan Discussed with: CRNA  Anesthesia Plan Comments:         Anesthesia Quick Evaluation

## 2011-11-14 NOTE — Op Note (Signed)
[facility] [address] [place], [address]   ENDOSCOPIC ULTRASOUND PROCEDURE REPORT  PATIENT: Brenda Warren, Brenda Warren  MR#: [id number] BIRTHDATE: Jun 24, 1990  GENDER: Female ENDOSCOPIST: Willis Modena, MD REFERRED BY:  Mila Palmer, M.D. PROCEDURE DATE:  11/14/2011 PROCEDURE:   Upper EUS , upper endoscopy with biopsies ASA CLASS:      Class I INDICATIONS:   esophageal nodule; right upper quadrant abdominal pain. MEDICATIONS: Cetacaine spray x 2 and MAC sedation, administered by CRNA  DESCRIPTION OF PROCEDURE:   After the risks benefits and alternatives of the procedure were  explained, informed consent was obtained. The patient was then placed in the left, lateral, decubitus postion and IV sedation was administered. Throughout the procedure, the patients blood pressure, pulse and oxygen saturations were monitored continuously.  Under direct visualization, the 110036  endoscope was introduced through the mouth  and advanced to the second portion of the duodenum .  Water was used as necessary to provide an acoustic interface.  Upon completion of the imaging, water was removed and the patient was sent to the recovery room in satisfactory condition.    FINDINGS:      Pancreas normal; no features of chronic pancreatitis, no mass, no cyst.  Gallbladder normal without wall thickening or stones or microlithiasis/sludge.  Limited views of left lobe of liver were normal.  Bile duct normal caliber without wall thickening or choledocholithiasis.  Nodule seen in distal esophagus (35cm, few cm proximal to GE junction).  Endoscopically, nodule is tan-colored, firm and readily mobile.  On EUS, it measures about 8mm x 5mm in size.  The nodule is hypoechoic, and appears submucosal and seemingly extends into the mucosa.  Lesion has some submucosa over it and does not appear to involve the muscularis propria.  Lesion was biopsied with cold forceps (pediatric, with scant tissue,  and then after switching to diagnostic endoscope, regular forceps with better tissue sampling).  Based on EUS characteristics, I didn't think FNA of the lesion would add much to my stacked mucosal biopsies.  IMPRESSION:     As above.  No explanation for the patient's abdominal pain was identified.  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  Leiomyoma arising from muscularis mucosa seems much less likely.   Lesion appears solid not cystic.  RECOMMENDATIONS:     1.  Watch for potential complications of procedure. 2.  Continue supportive therapies for presumed musculoskeletal cause of patient's right upper quadrant pain. 3.  Await biopsy results. 4.  Nodule management strategies in large part depend on biopsy findings, but could include watchful waiting versus endoscopic mucosal resection (at tertiary center) versus surgical resection. 5.  Follow-up with [facility] once biopsy results are available.   _______________________________ eSigned:  Willis Modena, MD 11/14/2011 2:21 PM   CC:

## 2011-11-14 NOTE — H&P (Signed)
Patient interval history reviewed.  Patient examined again.  There has been no change from documented H/P dated 11/09/11 (scanned into chart from our office) except as documented above.  Assessment:  1.  Esophageal nodule. 2.  Right upper quadrant abdominal pain.  Plan:  1.  Endoscopic ultrasound. 2.  Risks (bleeding, infection, bowel perforation that could require surgery, sedation-related changes in cardiopulmonary systems), benefits (identification and possible treatment of source of symptoms, exclusion of certain causes of symptoms), and alternatives (watchful waiting, radiographic imaging studies, empiric medical treatment) of upper endoscopy with ultrasound (EUS) were explained to patient in detail and she wishes to proceed.

## 2011-11-14 NOTE — Anesthesia Postprocedure Evaluation (Signed)
Anesthesia Post Note  Patient: Brenda Warren  Procedure(s) Performed: Procedure(s) (LRB): ESOPHAGEAL ENDOSCOPIC ULTRASOUND (EUS) RADIAL (N/A)  Anesthesia type: MAC  Patient location: PACU  Post pain: Pain level controlled  Post assessment: Post-op Vital signs reviewed  Last Vitals:  Filed Vitals:   11/14/11 1310  BP: 112/74  Temp:   Resp: 11    Post vital signs: Reviewed  Level of consciousness: sedated  Complications: No apparent anesthesia complications

## 2011-11-14 NOTE — Transfer of Care (Signed)
Immediate Anesthesia Transfer of Care Note  Patient: Brenda Warren  Procedure(s) Performed: Procedure(s) (LRB) with comments: ESOPHAGEAL ENDOSCOPIC ULTRASOUND (EUS) RADIAL (N/A)  Patient Location: PACU and Endoscopy Unit  Anesthesia Type: MAC  Level of Consciousness: awake, alert  and oriented  Airway & Oxygen Therapy: Patient Spontanous Breathing and Patient connected to nasal cannula oxygen  Post-op Assessment: Report given to PACU RN, Post -op Vital signs reviewed and stable and Patient moving all extremities  Post vital signs: Reviewed and stable  Complications: No apparent anesthesia complications

## 2011-11-15 ENCOUNTER — Encounter (HOSPITAL_COMMUNITY): Payer: Self-pay | Admitting: Gastroenterology

## 2014-12-27 ENCOUNTER — Other Ambulatory Visit: Payer: Self-pay | Admitting: Physician Assistant

## 2014-12-27 DIAGNOSIS — R112 Nausea with vomiting, unspecified: Secondary | ICD-10-CM

## 2014-12-27 DIAGNOSIS — K769 Liver disease, unspecified: Secondary | ICD-10-CM

## 2014-12-27 DIAGNOSIS — R1010 Upper abdominal pain, unspecified: Secondary | ICD-10-CM

## 2014-12-29 ENCOUNTER — Other Ambulatory Visit: Payer: Self-pay | Admitting: Physician Assistant

## 2014-12-29 ENCOUNTER — Ambulatory Visit
Admission: RE | Admit: 2014-12-29 | Discharge: 2014-12-29 | Disposition: A | Discharge status: Home / Self Care | Payer: Federal, State, Local not specified - PPO | Source: Ambulatory Visit | Attending: Physician Assistant | Admitting: Physician Assistant

## 2014-12-29 DIAGNOSIS — K769 Liver disease, unspecified: Secondary | ICD-10-CM

## 2014-12-29 DIAGNOSIS — R1010 Upper abdominal pain, unspecified: Secondary | ICD-10-CM

## 2014-12-29 DIAGNOSIS — R112 Nausea with vomiting, unspecified: Secondary | ICD-10-CM

## 2015-01-03 ENCOUNTER — Ambulatory Visit
Admission: RE | Admit: 2015-01-03 | Discharge: 2015-01-03 | Disposition: A | Discharge status: Home / Self Care | Payer: Federal, State, Local not specified - PPO | Source: Ambulatory Visit | Attending: Physician Assistant | Admitting: Physician Assistant

## 2015-01-03 DIAGNOSIS — R112 Nausea with vomiting, unspecified: Secondary | ICD-10-CM

## 2015-01-03 DIAGNOSIS — R1010 Upper abdominal pain, unspecified: Secondary | ICD-10-CM

## 2015-01-03 MED ORDER — IOPAMIDOL (ISOVUE-300) INJECTION 61%
100.0000 mL | Freq: Once | INTRAVENOUS | Status: AC | PRN
  Administered 2015-01-03: 100 mL via INTRAVENOUS

## 2015-03-23 ENCOUNTER — Emergency Department (HOSPITAL_COMMUNITY): Payer: 59

## 2015-03-23 ENCOUNTER — Encounter (HOSPITAL_COMMUNITY): Payer: Self-pay | Admitting: Emergency Medicine

## 2015-03-23 ENCOUNTER — Emergency Department (HOSPITAL_COMMUNITY)
Admission: EM | Admit: 2015-03-23 | Discharge: 2015-03-23 | Disposition: A | Discharge status: Home / Self Care | Payer: 59 | Attending: Emergency Medicine | Admitting: Emergency Medicine

## 2015-03-23 DIAGNOSIS — Z793 Long term (current) use of hormonal contraceptives: Secondary | ICD-10-CM | POA: Insufficient documentation

## 2015-03-23 DIAGNOSIS — Z3202 Encounter for pregnancy test, result negative: Secondary | ICD-10-CM | POA: Insufficient documentation

## 2015-03-23 DIAGNOSIS — Z79899 Other long term (current) drug therapy: Secondary | ICD-10-CM | POA: Diagnosis not present

## 2015-03-23 DIAGNOSIS — Z8742 Personal history of other diseases of the female genital tract: Secondary | ICD-10-CM | POA: Insufficient documentation

## 2015-03-23 DIAGNOSIS — R112 Nausea with vomiting, unspecified: Secondary | ICD-10-CM | POA: Insufficient documentation

## 2015-03-23 DIAGNOSIS — F329 Major depressive disorder, single episode, unspecified: Secondary | ICD-10-CM | POA: Diagnosis not present

## 2015-03-23 DIAGNOSIS — J45909 Unspecified asthma, uncomplicated: Secondary | ICD-10-CM | POA: Insufficient documentation

## 2015-03-23 DIAGNOSIS — Z8679 Personal history of other diseases of the circulatory system: Secondary | ICD-10-CM | POA: Diagnosis not present

## 2015-03-23 DIAGNOSIS — R1011 Right upper quadrant pain: Secondary | ICD-10-CM | POA: Diagnosis not present

## 2015-03-23 DIAGNOSIS — F419 Anxiety disorder, unspecified: Secondary | ICD-10-CM | POA: Insufficient documentation

## 2015-03-23 LAB — COMPREHENSIVE METABOLIC PANEL
ALK PHOS: 62 U/L (ref 38–126)
ALT: 14 U/L (ref 14–54)
ANION GAP: 16 — AB (ref 5–15)
AST: 20 U/L (ref 15–41)
Albumin: 4.2 g/dL (ref 3.5–5.0)
BILIRUBIN TOTAL: 0.4 mg/dL (ref 0.3–1.2)
BUN: 12 mg/dL (ref 6–20)
CALCIUM: 9.7 mg/dL (ref 8.9–10.3)
CO2: 24 mmol/L (ref 22–32)
CREATININE: 0.83 mg/dL (ref 0.44–1.00)
Chloride: 101 mmol/L (ref 101–111)
GFR calc non Af Amer: >60 mL/min (ref >60)
GLUCOSE: 115 mg/dL — AB (ref 65–99)
Potassium: 3.4 mmol/L — ABNORMAL LOW (ref 3.5–5.1)
Sodium: 141 mmol/L (ref 135–145)
TOTAL PROTEIN: 7.5 g/dL (ref 6.5–8.1)

## 2015-03-23 LAB — URINALYSIS, ROUTINE W REFLEX MICROSCOPIC
GLUCOSE, UA: NEGATIVE mg/dL
Ketones, ur: 15 mg/dL — AB
LEUKOCYTES UA: NEGATIVE
Nitrite: NEGATIVE
PROTEIN: 30 mg/dL — AB
SPECIFIC GRAVITY, URINE: 1.026 (ref 1.005–1.030)
pH: 5.5 (ref 5.0–8.0)

## 2015-03-23 LAB — CBC
HCT: 40.4 % (ref 36.0–46.0)
HEMOGLOBIN: 13.7 g/dL (ref 12.0–15.0)
MCH: 29.3 pg (ref 26.0–34.0)
MCHC: 33.9 g/dL (ref 30.0–36.0)
MCV: 86.5 fL (ref 78.0–100.0)
PLATELETS: 272 10*3/uL (ref 150–400)
RBC: 4.67 MIL/uL (ref 3.87–5.11)
RDW: 12.5 % (ref 11.5–15.5)
WBC: 12.3 10*3/uL — ABNORMAL HIGH (ref 4.0–10.5)

## 2015-03-23 LAB — URINE MICROSCOPIC-ADD ON

## 2015-03-23 LAB — I-STAT BETA HCG BLOOD, ED (MC, WL, AP ONLY)

## 2015-03-23 LAB — LIPASE, BLOOD: Lipase: 53 U/L — ABNORMAL HIGH (ref 11–51)

## 2015-03-23 MED ORDER — SCOPOLAMINE 1 MG/3DAYS TD PT72
1.0000 | MEDICATED_PATCH | TRANSDERMAL | Status: AC
Start: 2015-03-23 — End: ?

## 2015-03-23 MED ORDER — PROMETHAZINE HCL 25 MG PO TABS: 25.0000 mg | ORAL_TABLET | Freq: Four times a day (QID) | ORAL | Status: AC | PRN

## 2015-03-23 MED ORDER — MORPHINE SULFATE (PF) 4 MG/ML IV SOLN
4.0000 mg | INTRAVENOUS | Status: DC | PRN
  Administered 2015-03-23: 4 mg via INTRAVENOUS
  Filled 2015-03-23: qty 1

## 2015-03-23 MED ORDER — HYDROCODONE-ACETAMINOPHEN 5-325 MG PO TABS: 2.0000 | ORAL_TABLET | ORAL | Status: AC | PRN

## 2015-03-23 MED ORDER — PROMETHAZINE HCL 25 MG/ML IJ SOLN
12.5000 mg | Freq: Once | INTRAMUSCULAR | Status: AC
  Administered 2015-03-23: 12.5 mg via INTRAVENOUS
  Filled 2015-03-23: qty 1

## 2015-03-23 MED ORDER — FENTANYL CITRATE (PF) 100 MCG/2ML IJ SOLN
50.0000 ug | Freq: Once | INTRAMUSCULAR | Status: AC
  Administered 2015-03-23: 50 ug via NASAL

## 2015-03-23 MED ORDER — FENTANYL CITRATE (PF) 100 MCG/2ML IJ SOLN
INTRAMUSCULAR | Status: AC
  Filled 2015-03-23: qty 2

## 2015-03-23 MED ORDER — PANTOPRAZOLE SODIUM 40 MG IV SOLR
40.0000 mg | Freq: Once | INTRAVENOUS | Status: AC
  Administered 2015-03-23: 40 mg via INTRAVENOUS
  Filled 2015-03-23: qty 40

## 2015-03-23 MED ORDER — SODIUM CHLORIDE 0.9 % IV BOLUS (SEPSIS)
1000.0000 mL | Freq: Once | INTRAVENOUS | Status: AC
  Administered 2015-03-23: 1000 mL via INTRAVENOUS

## 2015-03-23 NOTE — Discharge Instructions (Signed)
Continue current medications.  Scopolamine patch as needed  Call general surgery office above for appointment.  continue your planned follow-up with Dr. Paulita Fujita

## 2015-03-23 NOTE — ED Notes (Signed)
Discharge instructions and prescriptions reviewed - voiced understanding 

## 2015-03-23 NOTE — ED Notes (Signed)
Pt from home for eval of RUQ pain with emesis, pt states saw pcp today and was told had elevated WBC count. Pt states has had CT scans done but has not gotten any answers. Pt tearful in triage, states zofran does not work when offered.

## 2015-03-24 NOTE — ED Provider Notes (Signed)
CSN: [id number]     Arrival date & time 03/23/15  1537 History   First MD Initiated Contact with Patient 03/23/15 1750     Chief Complaint  Patient presents with  . Abdominal Pain  . Emesis      HPI  Patient resists for evaluation of right upper quadrant pain. She immediately expressed frustration that she had a recurrence of symptoms for almost a years time. Has had testing including Heide scan and ultrasound previous normal CT scans. States it started with nausea. Quickly develops into abdominal pain localized to her right side. No chest pain or pleuritic discomfort. No shortness of breath. States greasy food reproduces her symptoms. She states "it sounds that my gallbladder but this never been abnormal. Offered Zofran at triage. She states that this typically does not work.  Past Medical History  Diagnosis Date  . Anxiety   . Depression   . Ovarian cyst   . Asthma   . Migraines    Past Surgical History  Procedure Laterality Date  . Right knee surgery      arthroscopy; plica syndrome   . Esophagogastroduodenoscopy    . Eus  11/14/2011    Procedure: ESOPHAGEAL ENDOSCOPIC ULTRASOUND (EUS) RADIAL;  Surgeon: Arta Silence, MD;  Location: [facility];  Service: Endoscopy;  Laterality: N/A;   Family History  Problem Relation Age of Onset  . Gallbladder disease Father    Social History  Substance Use Topics  . Smoking status: Never Smoker   . Smokeless tobacco: None  . Alcohol Use: No   OB History    No data available     Review of Systems  Constitutional: Negative for fever, chills, diaphoresis, appetite change and fatigue.  HENT: Negative for mouth sores, sore throat and trouble swallowing.   Eyes: Negative for visual disturbance.  Respiratory: Negative for cough, chest tightness, shortness of breath and wheezing.   Cardiovascular: Negative for chest pain.  Gastrointestinal: Positive for nausea, vomiting and abdominal pain. Negative for diarrhea and abdominal  distention.  Endocrine: Negative for polydipsia, polyphagia and polyuria.  Genitourinary: Negative for dysuria, frequency and hematuria.  Musculoskeletal: Negative for gait problem.  Skin: Negative for color change, pallor and rash.  Neurological: Negative for dizziness, syncope, light-headedness and headaches.  Hematological: Does not bruise/bleed easily.  Psychiatric/Behavioral: Negative for behavioral problems and confusion.      Allergies  Banana; Orange concentrate; Other; Peach; Tomato; Watermelon concentrate; Darvocet; and Milk-related compounds  Home Medications   Prior to Admission medications   Medication Sig Start Date End Date Taking? Authorizing Provider  acetaminophen (TYLENOL) 500 MG tablet Take 1,000 mg by mouth every 6 (six) hours as needed for mild pain, moderate pain or headache.    Yes Historical Provider, MD  ALPRAZolam (XANAX) 0.25 MG tablet Take 0.125-0.25 mg by mouth at bedtime as needed for anxiety. For anxiety   Yes Historical Provider, MD  EPINEPHrine (EPI-PEN) 0.3 mg/0.3 mL DEVI Inject 0.3 mg into the muscle once.   Yes Historical Provider, MD  escitalopram (LEXAPRO) 20 MG tablet Take 20 mg by mouth at bedtime.   Yes Historical Provider, MD  fluticasone (FLONASE) 50 MCG/ACT nasal spray Place 2 sprays into both nostrils daily as needed for allergies.  03/02/15  Yes Historical Provider, MD  MedroxyPROGESTERone Acetate 150 MG/ML SUSY Inject 1 mL into the muscle every 3 (three) months. 02/23/15  Yes Historical Provider, MD  HYDROcodone-acetaminophen (NORCO/VICODIN) 5-325 MG tablet Take 2 tablets by mouth every 4 (four) hours as needed. 03/23/15  Tanna Furry, MD  promethazine (PHENERGAN) 25 MG tablet Take 1 tablet (25 mg total) by mouth every 6 (six) hours as needed for nausea or vomiting. 03/23/15   Tanna Furry, MD  scopolamine (TRANSDERM-SCOP, 1.5 MG,) 1 MG/3DAYS Place 1 patch (1.5 mg total) onto the skin every 3 (three) days. 03/23/15   Tanna Furry, MD   BP 97/57 mmHg   Pulse 75  Temp(Src) 98.9 F (37.2 C) (Oral)  Resp 16  Ht 5\' 2"  (1.575 m)  Wt 125 lb (56.7 kg)  BMI 22.86 kg/m2  SpO2 97%  LMP 02/23/2015 Physical Exam  Constitutional: She is oriented to person, place, and time. She appears well-developed and well-nourished. No distress.  HENT:  Head: Normocephalic.  Eyes: Conjunctivae are normal. Pupils are equal, round, and reactive to light. No scleral icterus.  Neck: Normal range of motion. Neck supple. No thyromegaly present.  Cardiovascular: Normal rate and regular rhythm.  Exam reveals no gallop and no friction rub.   No murmur heard. Pulmonary/Chest: Effort normal and breath sounds normal. No respiratory distress. She has no wheezes. She has no rales.  Abdominal: Soft. Bowel sounds are normal. She exhibits no distension. There is no tenderness. There is no rebound.    Musculoskeletal: Normal range of motion.  Neurological: She is alert and oriented to person, place, and time.  Skin: Skin is warm and dry. No rash noted.  Psychiatric: She has a normal mood and affect. Her behavior is normal.    ED Course  Procedures (including critical care time) Labs Review Labs Reviewed  LIPASE, BLOOD - Abnormal; Notable for the following:    Lipase 53 (*)    All other components within normal limits  COMPREHENSIVE METABOLIC PANEL - Abnormal; Notable for the following:    Potassium 3.4 (*)    Glucose, Bld 115 (*)    Anion gap 16 (*)    All other components within normal limits  CBC - Abnormal; Notable for the following:    WBC 12.3 (*)    All other components within normal limits  URINALYSIS, ROUTINE W REFLEX MICROSCOPIC (NOT AT [facility]) - Abnormal; Notable for the following:    Color, Urine AMBER (*)    APPearance CLOUDY (*)    Hgb urine dipstick LARGE (*)    Bilirubin Urine SMALL (*)    Ketones, ur 15 (*)    Protein, ur 30 (*)    All other components within normal limits  URINE MICROSCOPIC-ADD ON - Abnormal; Notable for the following:     Squamous Epithelial / LPF 0-5 (*)    Bacteria, UA FEW (*)    All other components within normal limits  I-STAT BETA HCG BLOOD, ED ([facility], [facility], [facility] ONLY)    Imaging Review Ct Renal Stone Study  03/23/2015  CLINICAL DATA:  Right flank pain today with leukocytosis. EXAM: CT ABDOMEN AND PELVIS WITHOUT CONTRAST TECHNIQUE: Multidetector CT imaging of the abdomen and pelvis was performed following the standard protocol without IV contrast. COMPARISON:  01/03/2015 FINDINGS: Lower chest:  Unremarkable. Hepatobiliary: Small area of low attenuation in the anterior liver, adjacent to the falciform ligament, is in a characteristic location for focal fatty change. There is no evidence for gallstones, gallbladder wall thickening, or pericholecystic fluid. No intrahepatic or extrahepatic biliary dilation. Pancreas: No focal mass lesion. No dilatation of the main duct. No intraparenchymal cyst. No peripancreatic edema. Spleen: No splenomegaly. No focal mass lesion. Adrenals/Urinary Tract: No adrenal nodule or mass. Two punctate nonobstructing stones are seen in the  right kidney. A single 1-2 mm nonobstructing stone is seen in the left kidney. No evidence for ureteral or bladder stones. Stomach/Bowel: Stomach is nondistended. No gastric wall thickening. No evidence of outlet obstruction. Duodenum is normally positioned as is the ligament of Treitz. No small bowel wall thickening. No small bowel dilatation. The terminal ileum is normal. The appendix is normal. No gross colonic mass. No colonic wall thickening. No substantial diverticular change. Vascular/Lymphatic: No abdominal aortic aneurysm. No abdominal atherosclerotic calcification. There is no gastrohepatic or hepatoduodenal ligament lymphadenopathy. No intraperitoneal or retroperitoneal lymphadenopathy. Nonenlarged retroperitoneal lymph nodes are evident. No pelvic sidewall lymphadenopathy. Reproductive: The uterus has normal CT imaging appearance. There is no adnexal mass.  Other: No intraperitoneal free fluid. Musculoskeletal: Bone windows reveal no worrisome lytic or sclerotic osseous lesions. IMPRESSION: 1. Tiny bilateral nonobstructing renal stones. No secondary changes in the kidneys or ureters. 2. Otherwise unremarkable exam. Electronically Signed   By: Misty Stanley M.D.   On: 03/23/2015 20:58   US Abdomen Limited Ruq  03/23/2015  CLINICAL DATA:  Right upper quadrant pain for 3 months with nausea and vomiting EXAM: US ABDOMEN LIMITED - RIGHT UPPER QUADRANT COMPARISON:  01/03/2015 FINDINGS: Gallbladder: No gallstones or wall thickening visualized. No sonographic Murphy sign noted by sonographer. Common bile duct: Diameter: 3.5 mm. Liver: No focal lesion identified. Within normal limits in parenchymal echogenicity. IMPRESSION: No acute abnormality noted. Electronically Signed   By: Inez Catalina M.D.   On: 03/23/2015 19:44   I have personally reviewed and evaluated these images and lab results as part of my medical decision-making.   EKG Interpretation None      MDM   Final diagnoses:  RUQ pain    Advised surgical fu to discuss HIDA.  Also GI f/u.  Continue current PPI.    Tanna Furry, MD 03/24/15 [phone]

## 2017-02-16 IMAGING — US US ABDOMEN LIMITED
1 series · 14 of 25 positions shown · non-contrast
Comparison: 01/03/2015

CLINICAL DATA: Right upper quadrant pain for 3 months with nausea
and vomiting

EXAM:
US ABDOMEN LIMITED - RIGHT UPPER QUADRANT

[Series 1: us abdomen limited · 0.14mm/px · 14 of 39 slices shown]
[im 1/39]
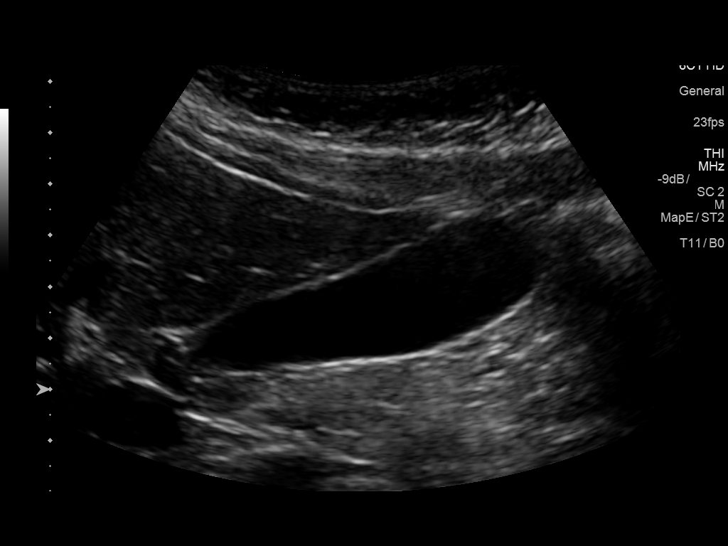
[im 4/39]
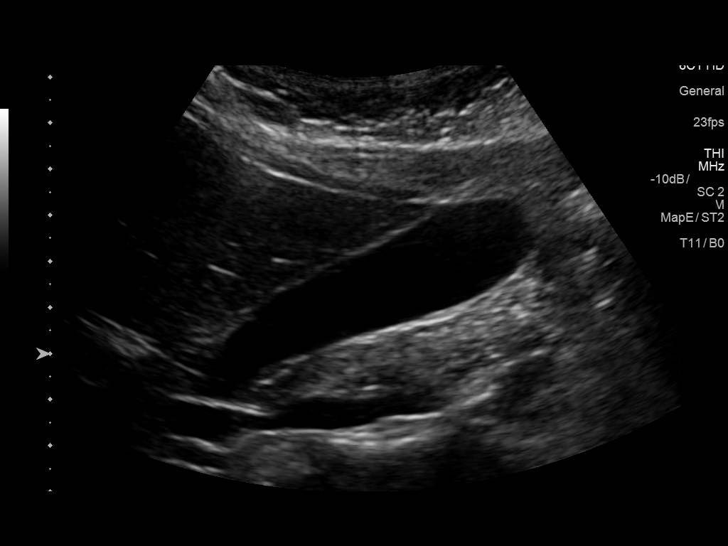
[im 7/39]
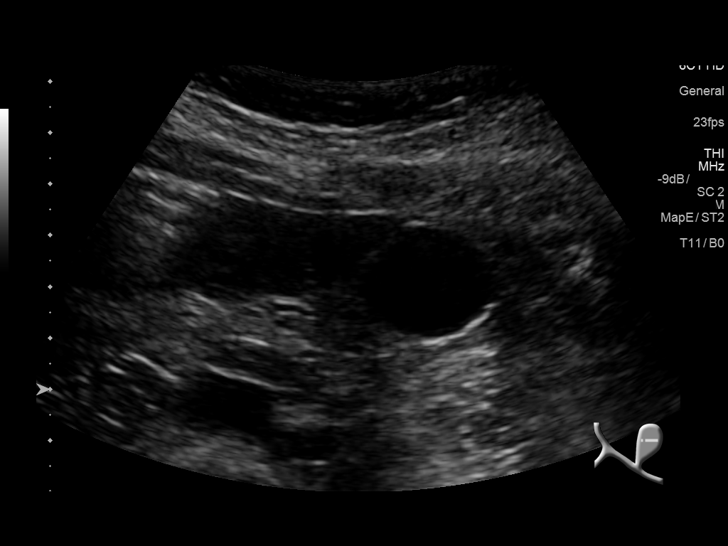
[im 10/39]
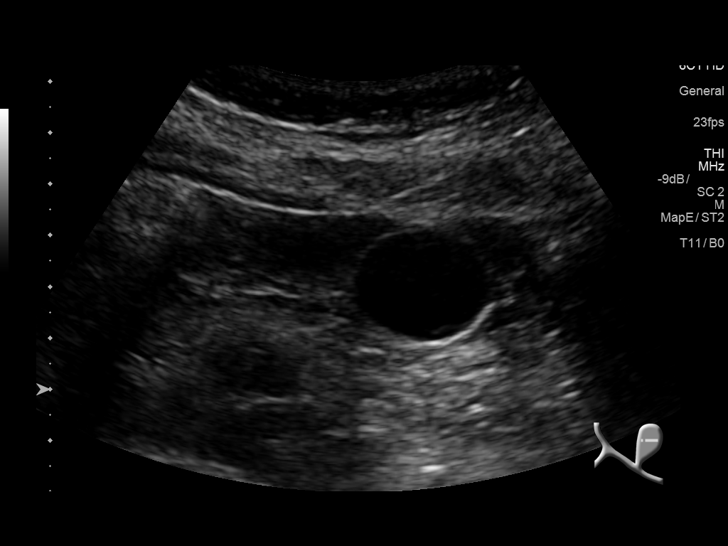
[im 13/39]
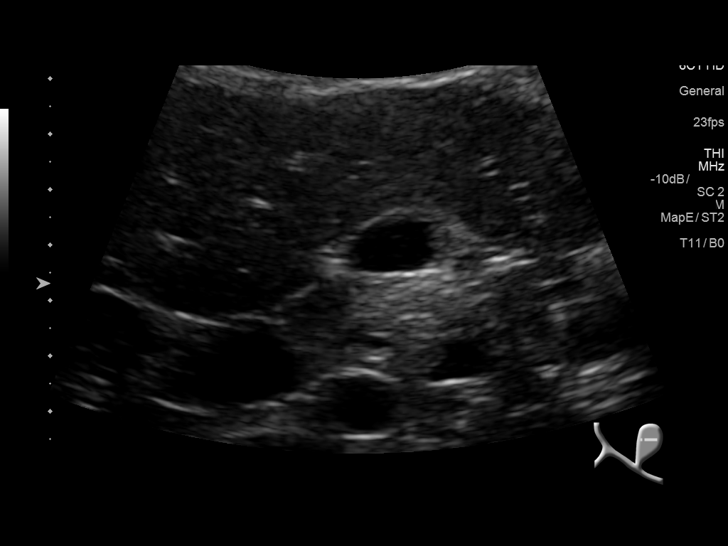
[im 15/39]
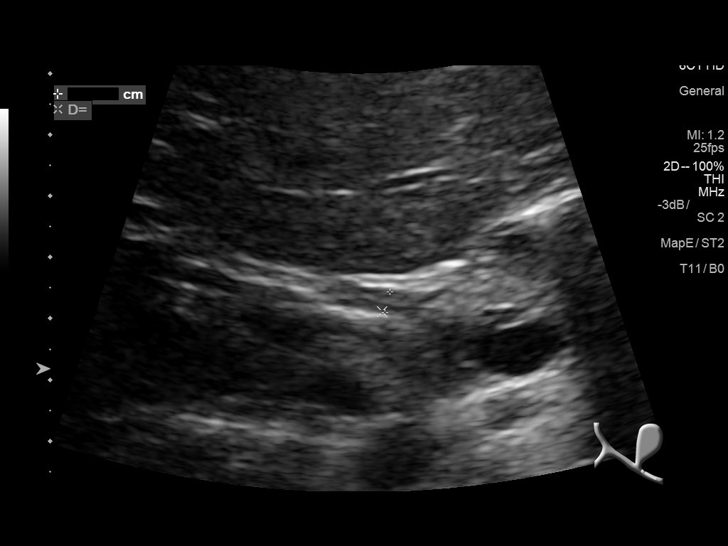
[im 18/39]
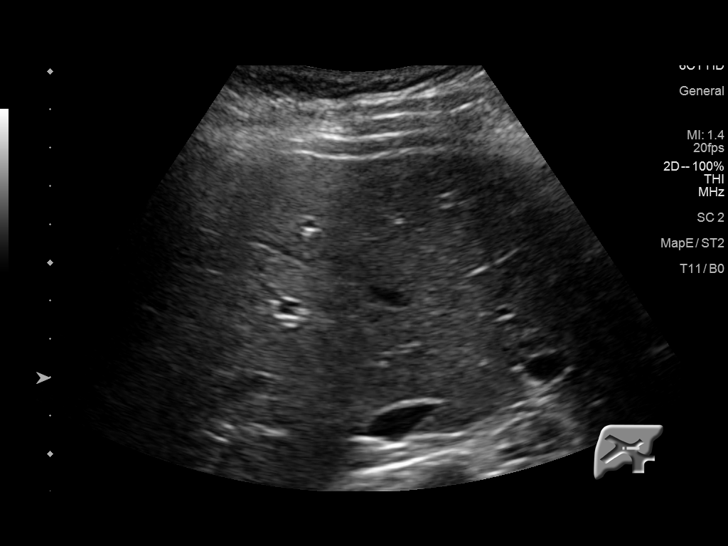
[im 21/39]
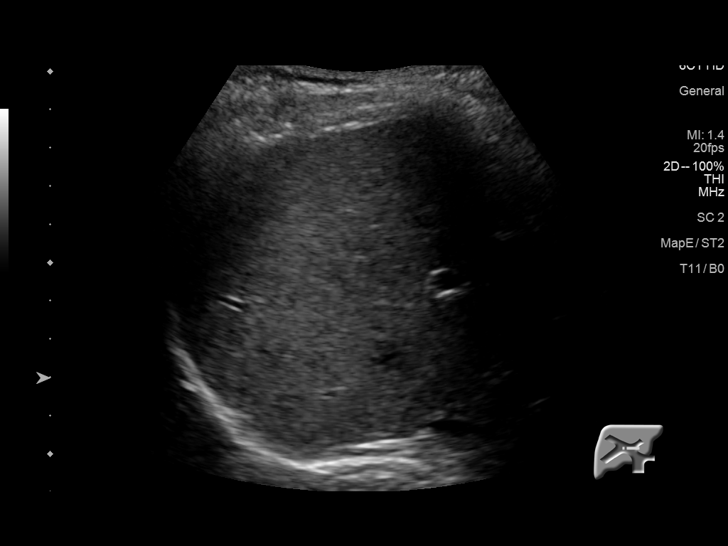
[im 24/39]
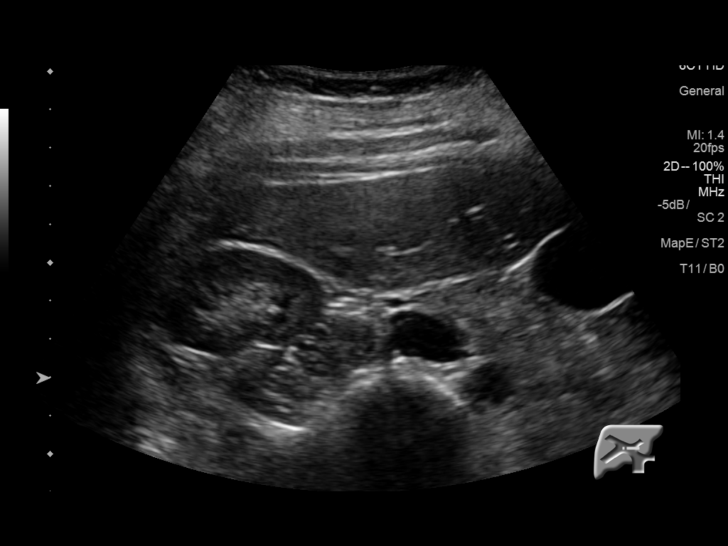
[im 26/39]
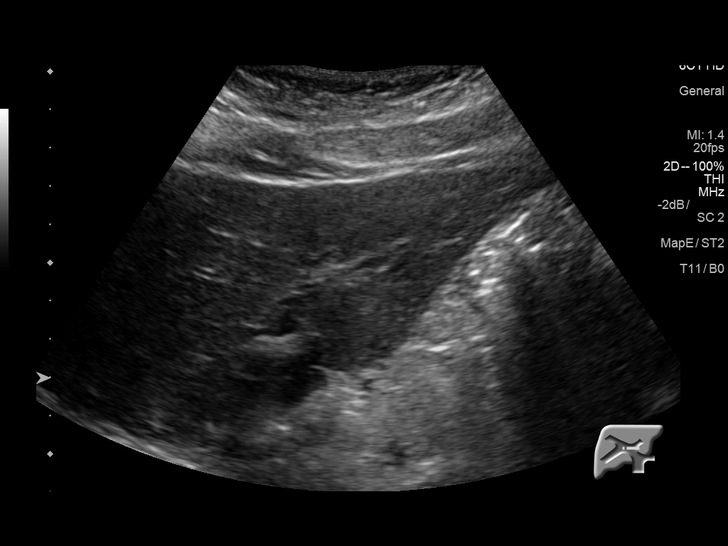
[im 29/39]
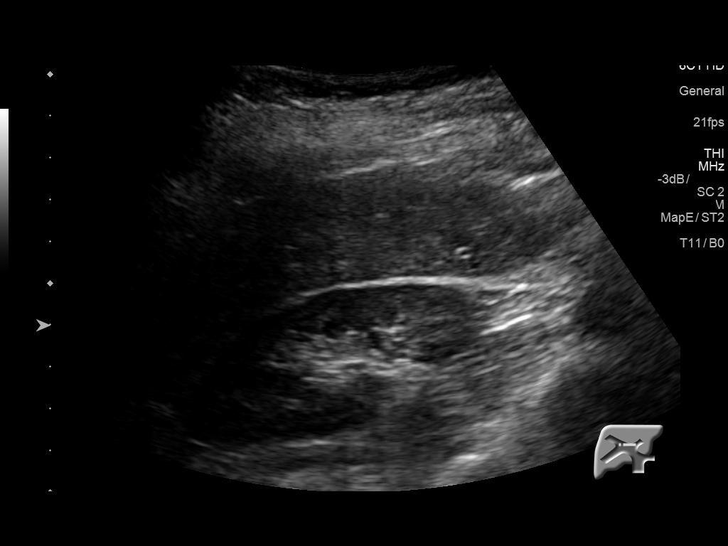
[im 32/39]
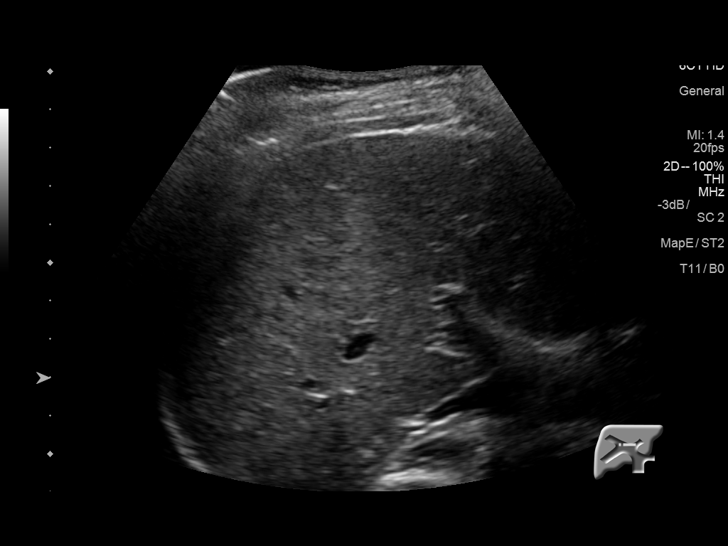
[im 35/39]
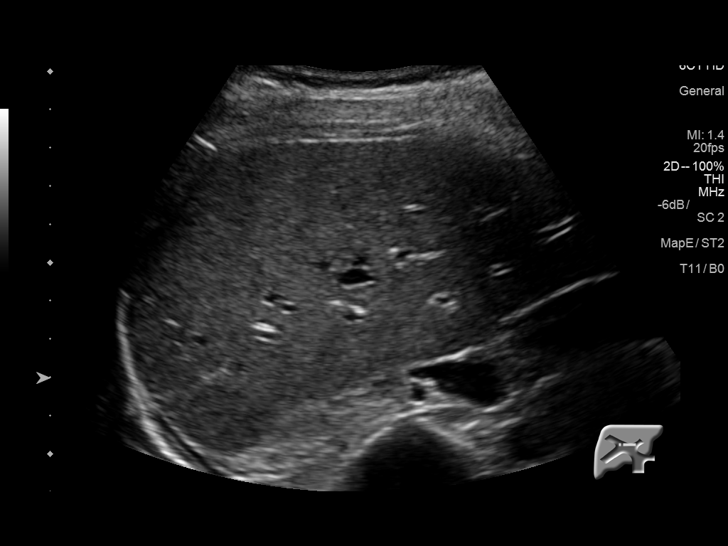
[im 39/39]
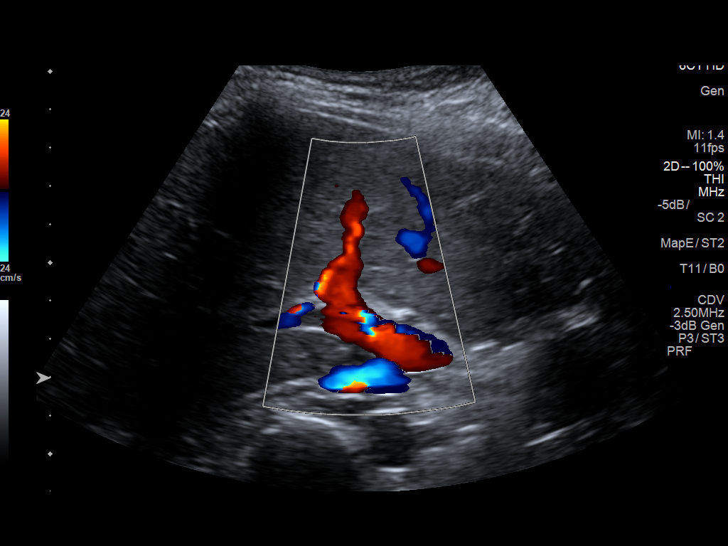

[14 of 25 positions shown; findings below may reference images not displayed]

FINDINGS: Gallbladder:

No gallstones or wall thickening visualized. No sonographic Murphy
sign noted by sonographer.

Common bile duct:

Diameter: 3.5 mm.

Liver:

No focal lesion identified. Within normal limits in parenchymal
echogenicity.
IMPRESSION: No acute abnormality noted.

## 2017-03-18 IMAGING — CT CT RENAL STONE PROTOCOL
2 of 4 series · 15 of 46 positions shown, 17 images · non-contrast
Comparison: 01/03/2015

CLINICAL DATA: Right flank pain today with leukocytosis.

EXAM:
CT ABDOMEN AND PELVIS WITHOUT CONTRAST
TECHNIQUE: Multidetector CT imaging of the abdomen and pelvis was performed
following the standard protocol without IV contrast.

[Series 2: stone study 5.0 i30f 1 · axial · 0.65mm/px · z∈[+690,+1045]mm · 12 of 82 slices shown, 14 images]
[im 7/82  soft-tissue]
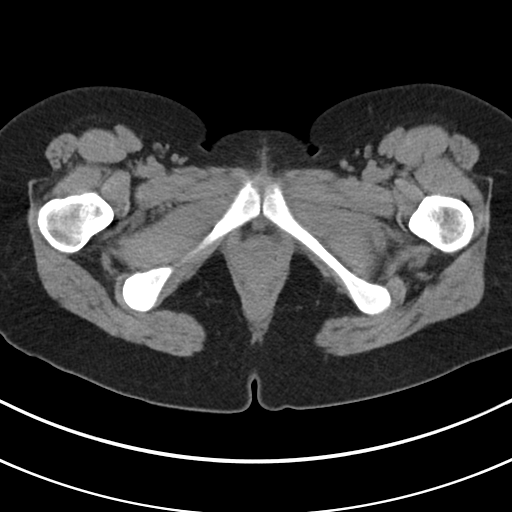
[im 7/82  bone]
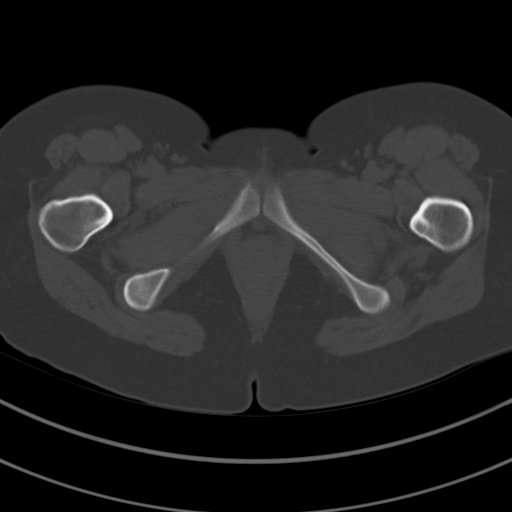
[im 13/82  soft-tissue]
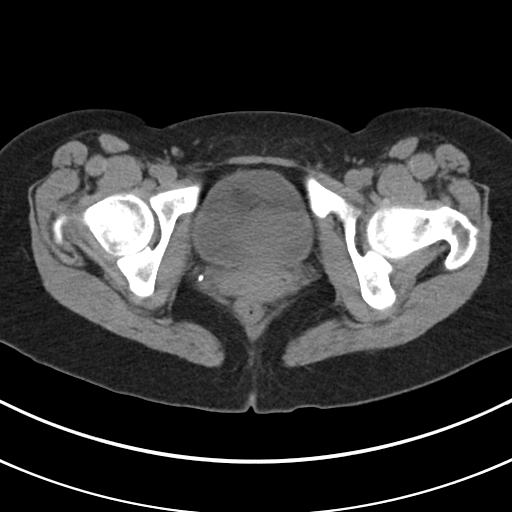
[im 20/82  soft-tissue]
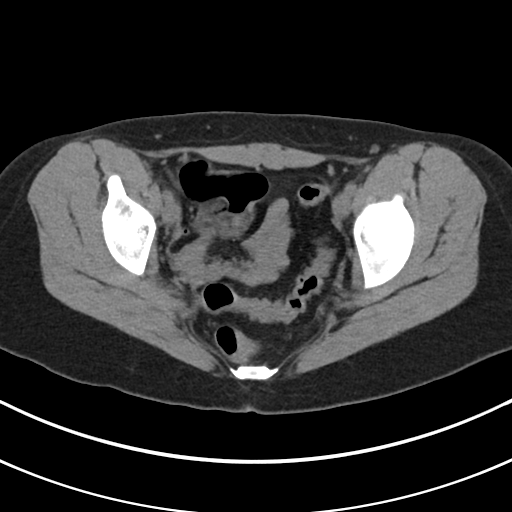
[im 26/82  soft-tissue]
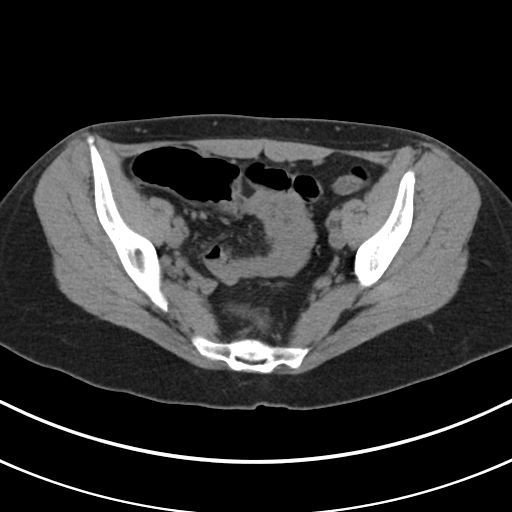
[im 33/82  soft-tissue]
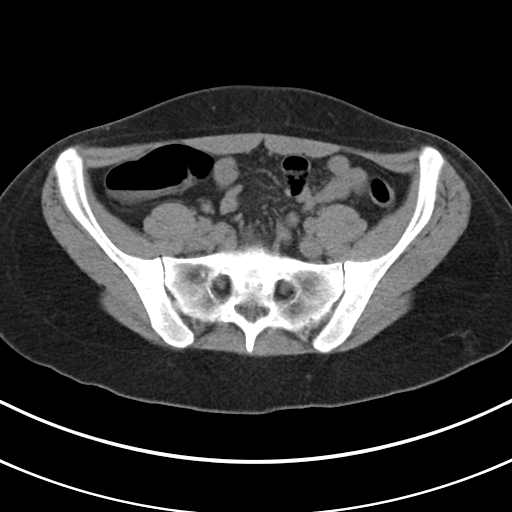
[im 39/82  soft-tissue]
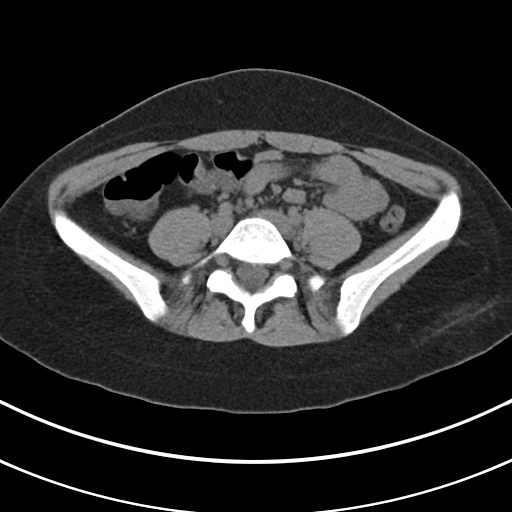
[im 46/82  soft-tissue]
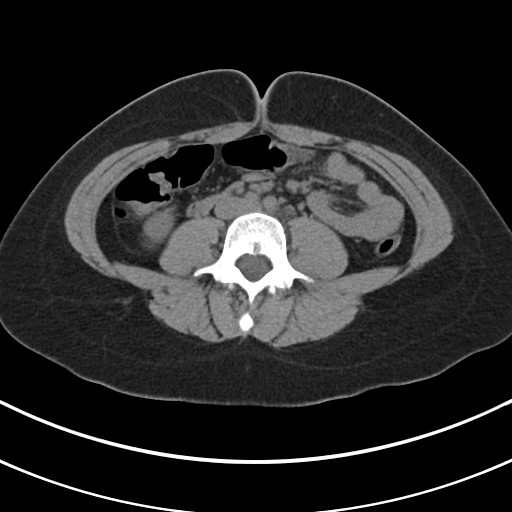
[im 52/82  soft-tissue]
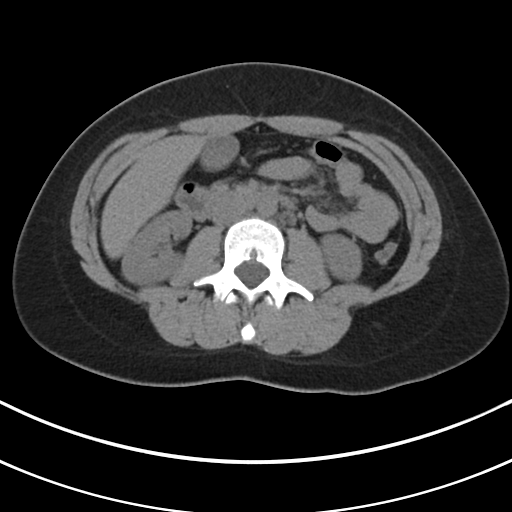
[im 59/82  soft-tissue]
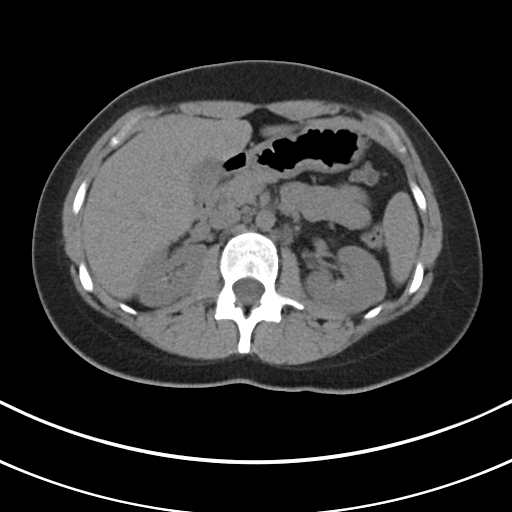
[im 59/82  bone]
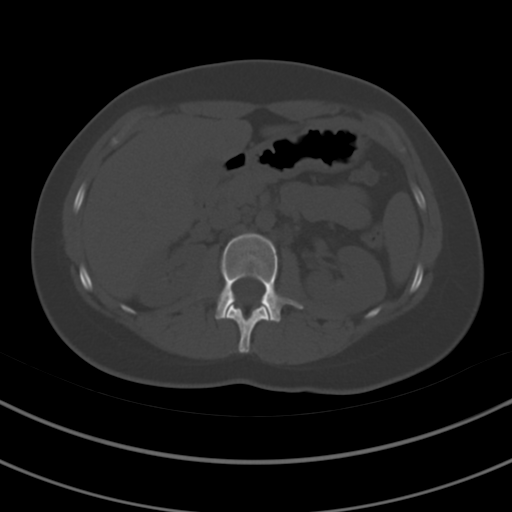
[im 65/82  soft-tissue]
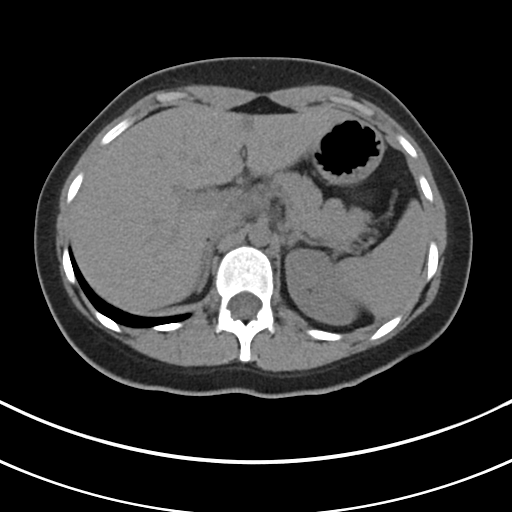
[im 72/82  soft-tissue]
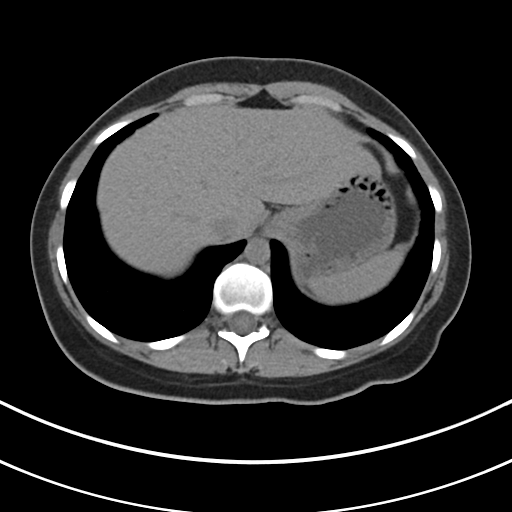
[im 78/82  soft-tissue]
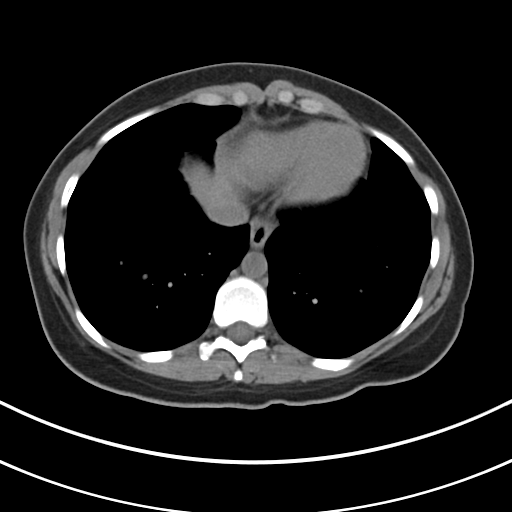

[Series 5: coronal soft tissue · coronal · 0.62mm/px · 3 of 73 slices shown]
[im 25/73  soft-tissue]
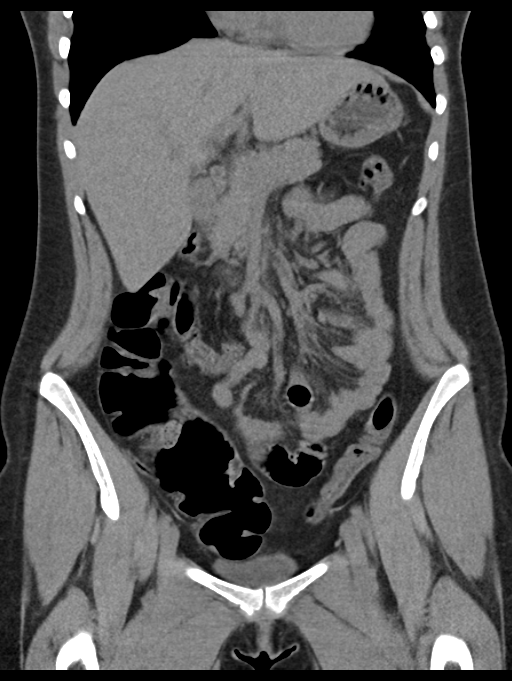
[im 33/73  soft-tissue]
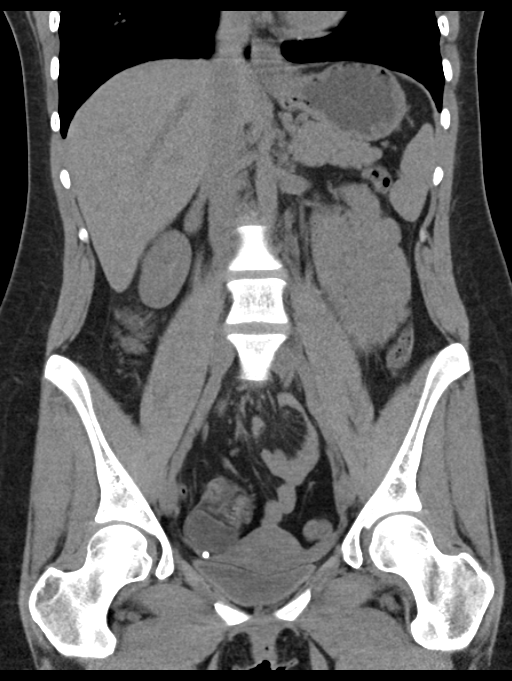
[im 41/73  soft-tissue]
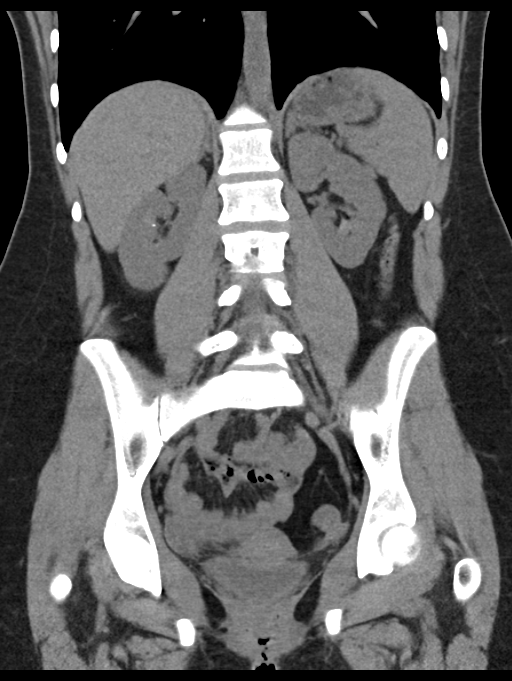

[15 of 46 positions shown; findings below may reference images not displayed]

FINDINGS: Lower chest:  Unremarkable.

Hepatobiliary: Small area of low attenuation in the anterior liver,
adjacent to the falciform ligament, is in a characteristic location
for focal fatty change. There is no evidence for gallstones,
gallbladder wall thickening, or pericholecystic fluid. No
intrahepatic or extrahepatic biliary dilation.

Pancreas: No focal mass lesion. No dilatation of the main duct. No
intraparenchymal cyst. No peripancreatic edema.

Spleen: No splenomegaly. No focal mass lesion.

Adrenals/Urinary Tract: No adrenal nodule or mass. Two punctate
nonobstructing stones are seen in the right kidney. A single 1-2 mm
nonobstructing stone is seen in the left kidney. No evidence for
ureteral or bladder stones.

Stomach/Bowel: Stomach is nondistended. No gastric wall thickening.
No evidence of outlet obstruction. Duodenum is normally positioned
as is the ligament of Treitz. No small bowel wall thickening. No
small bowel dilatation. The terminal ileum is normal. The appendix
is normal. No gross colonic mass. No colonic wall thickening. No
substantial diverticular change.

Vascular/Lymphatic: No abdominal aortic aneurysm. No abdominal
atherosclerotic calcification. There is no gastrohepatic or
hepatoduodenal ligament lymphadenopathy. No intraperitoneal or
retroperitoneal lymphadenopathy. Nonenlarged retroperitoneal lymph
nodes are evident. No pelvic sidewall lymphadenopathy.

Reproductive: The uterus has normal CT imaging appearance. There is
no adnexal mass.

Other: No intraperitoneal free fluid.

Musculoskeletal: Bone windows reveal no worrisome lytic or sclerotic
osseous lesions.
IMPRESSION: 1. Tiny bilateral nonobstructing renal stones. No secondary changes
in the kidneys or ureters.
2. Otherwise unremarkable exam.

## 2017-12-31 ENCOUNTER — Other Ambulatory Visit: Payer: Self-pay | Admitting: Obstetrics and Gynecology

## 2017-12-31 ENCOUNTER — Other Ambulatory Visit (HOSPITAL_COMMUNITY)
Admission: RE | Admit: 2017-12-31 | Discharge: 2017-12-31 | Disposition: A | Discharge status: Home / Self Care | Payer: 59 | Source: Ambulatory Visit | Attending: Obstetrics and Gynecology | Admitting: Obstetrics and Gynecology

## 2017-12-31 DIAGNOSIS — Z01419 Encounter for gynecological examination (general) (routine) without abnormal findings: Secondary | ICD-10-CM | POA: Diagnosis present

## 2018-01-02 LAB — CYTOLOGY - PAP: DIAGNOSIS: NEGATIVE

## 2020-08-16 ENCOUNTER — Encounter (HOSPITAL_COMMUNITY): Payer: Self-pay

## 2020-08-16 ENCOUNTER — Other Ambulatory Visit: Payer: Self-pay

## 2020-08-16 ENCOUNTER — Emergency Department (HOSPITAL_COMMUNITY)
Admission: EM | Admit: 2020-08-16 | Discharge: 2020-08-16 | Disposition: A | Discharge status: Home / Self Care | Payer: 59 | Attending: Emergency Medicine | Admitting: Emergency Medicine

## 2020-08-16 DIAGNOSIS — G43A0 Cyclical vomiting, not intractable: Secondary | ICD-10-CM | POA: Insufficient documentation

## 2020-08-16 DIAGNOSIS — R1110 Vomiting, unspecified: Secondary | ICD-10-CM | POA: Diagnosis present

## 2020-08-16 DIAGNOSIS — Z20822 Contact with and (suspected) exposure to covid-19: Secondary | ICD-10-CM | POA: Diagnosis not present

## 2020-08-16 DIAGNOSIS — R1115 Cyclical vomiting syndrome unrelated to migraine: Secondary | ICD-10-CM

## 2020-08-16 DIAGNOSIS — N9489 Other specified conditions associated with female genital organs and menstrual cycle: Secondary | ICD-10-CM | POA: Insufficient documentation

## 2020-08-16 DIAGNOSIS — R079 Chest pain, unspecified: Secondary | ICD-10-CM | POA: Insufficient documentation

## 2020-08-16 DIAGNOSIS — E876 Hypokalemia: Secondary | ICD-10-CM | POA: Diagnosis not present

## 2020-08-16 DIAGNOSIS — Z79899 Other long term (current) drug therapy: Secondary | ICD-10-CM | POA: Diagnosis not present

## 2020-08-16 DIAGNOSIS — R000 Tachycardia, unspecified: Secondary | ICD-10-CM | POA: Insufficient documentation

## 2020-08-16 DIAGNOSIS — J45909 Unspecified asthma, uncomplicated: Secondary | ICD-10-CM | POA: Diagnosis not present

## 2020-08-16 HISTORY — DX: Cyclical vomiting syndrome unrelated to migraine: R11.15

## 2020-08-16 LAB — URINALYSIS, ROUTINE W REFLEX MICROSCOPIC
Bacteria, UA: NONE SEEN
Bilirubin Urine: NEGATIVE
Glucose, UA: NEGATIVE mg/dL
Ketones, ur: 5 mg/dL — AB
Leukocytes,Ua: NEGATIVE
Nitrite: NEGATIVE
Protein, ur: 100 mg/dL — AB
Specific Gravity, Urine: 1.028 (ref 1.005–1.030)
pH: 6 (ref 5.0–8.0)

## 2020-08-16 LAB — COMPREHENSIVE METABOLIC PANEL
ALT: 24 U/L (ref 0–44)
AST: 24 U/L (ref 15–41)
Albumin: 5.6 g/dL — ABNORMAL HIGH (ref 3.5–5.0)
Alkaline Phosphatase: 79 U/L (ref 38–126)
Anion gap: 17 — ABNORMAL HIGH (ref 5–15)
BUN: 27 mg/dL — ABNORMAL HIGH (ref 6–20)
CO2: 29 mmol/L (ref 22–32)
Calcium: 10 mg/dL (ref 8.9–10.3)
Chloride: 84 mmol/L — ABNORMAL LOW (ref 98–111)
Creatinine, Ser: 1.1 mg/dL — ABNORMAL HIGH (ref 0.44–1.00)
GFR, Estimated: >60 mL/min (ref >60)
Glucose, Bld: 127 mg/dL — ABNORMAL HIGH (ref 70–99)
Potassium: 2.1 mmol/L — CL (ref 3.5–5.1)
Sodium: 130 mmol/L — ABNORMAL LOW (ref 135–145)
Total Bilirubin: 2.5 mg/dL — ABNORMAL HIGH (ref 0.3–1.2)
Total Protein: 9.7 g/dL — ABNORMAL HIGH (ref 6.5–8.1)

## 2020-08-16 LAB — CBC WITH DIFFERENTIAL/PLATELET
Abs Immature Granulocytes: 0.06 10*3/uL (ref 0.00–0.07)
Basophils Absolute: 0 10*3/uL (ref 0.0–0.1)
Basophils Relative: 0 %
Eosinophils Absolute: 0 10*3/uL (ref 0.0–0.5)
Eosinophils Relative: 0 %
HCT: 47.9 % — ABNORMAL HIGH (ref 36.0–46.0)
Hemoglobin: 17.4 g/dL — ABNORMAL HIGH (ref 12.0–15.0)
Immature Granulocytes: 1 %
Lymphocytes Relative: 25 %
Lymphs Abs: 3.2 10*3/uL (ref 0.7–4.0)
MCH: 30 pg (ref 26.0–34.0)
MCHC: 36.3 g/dL — ABNORMAL HIGH (ref 30.0–36.0)
MCV: 82.6 fL (ref 80.0–100.0)
Monocytes Absolute: 1.7 10*3/uL — ABNORMAL HIGH (ref 0.1–1.0)
Monocytes Relative: 13 %
Neutro Abs: 7.6 10*3/uL (ref 1.7–7.7)
Neutrophils Relative %: 61 %
Platelets: 369 10*3/uL (ref 150–400)
RBC: 5.8 MIL/uL — ABNORMAL HIGH (ref 3.87–5.11)
RDW: 12.4 % (ref 11.5–15.5)
WBC: 12.6 10*3/uL — ABNORMAL HIGH (ref 4.0–10.5)
nRBC: 0 % (ref 0.0–0.2)

## 2020-08-16 LAB — MAGNESIUM: Magnesium: 2.2 mg/dL (ref 1.7–2.4)

## 2020-08-16 LAB — RESP PANEL BY RT-PCR (FLU A&B, COVID) ARPGX2
Influenza A by PCR: NEGATIVE
Influenza B by PCR: NEGATIVE
SARS Coronavirus 2 by RT PCR: NEGATIVE

## 2020-08-16 LAB — BASIC METABOLIC PANEL
Anion gap: 13 (ref 5–15)
BUN: 23 mg/dL — ABNORMAL HIGH (ref 6–20)
CO2: 25 mmol/L (ref 22–32)
Calcium: 8.5 mg/dL — ABNORMAL LOW (ref 8.9–10.3)
Chloride: 92 mmol/L — ABNORMAL LOW (ref 98–111)
Creatinine, Ser: 0.95 mg/dL (ref 0.44–1.00)
GFR, Estimated: >60 mL/min (ref >60)
Glucose, Bld: 108 mg/dL — ABNORMAL HIGH (ref 70–99)
Potassium: 2.9 mmol/L — ABNORMAL LOW (ref 3.5–5.1)
Sodium: 130 mmol/L — ABNORMAL LOW (ref 135–145)

## 2020-08-16 LAB — I-STAT BETA HCG BLOOD, ED (MC, WL, AP ONLY): I-stat hCG, quantitative: <5 m[IU]/mL (ref <5)

## 2020-08-16 MED ORDER — ONDANSETRON HCL 4 MG/2ML IJ SOLN
4.0000 mg | Freq: Once | INTRAMUSCULAR | Status: AC
  Administered 2020-08-16: 4 mg via INTRAVENOUS
  Filled 2020-08-16: qty 2

## 2020-08-16 MED ORDER — PANTOPRAZOLE SODIUM 40 MG IV SOLR
40.0000 mg | Freq: Once | INTRAVENOUS | Status: AC
  Administered 2020-08-16: 40 mg via INTRAVENOUS
  Filled 2020-08-16: qty 40

## 2020-08-16 MED ORDER — POTASSIUM CHLORIDE CRYS ER 20 MEQ PO TBCR: 20.0000 meq | EXTENDED_RELEASE_TABLET | Freq: Two times a day (BID) | ORAL | 0 refills | Status: AC

## 2020-08-16 MED ORDER — POTASSIUM CHLORIDE 10 MEQ/100ML IV SOLN
10.0000 meq | INTRAVENOUS | Status: AC
  Administered 2020-08-16 (×3): 10 meq via INTRAVENOUS
  Filled 2020-08-16 (×3): qty 100

## 2020-08-16 MED ORDER — ONDANSETRON 4 MG PO TBDP: 4.0000 mg | ORAL_TABLET | Freq: Three times a day (TID) | ORAL | 0 refills | Status: AC | PRN

## 2020-08-16 MED ORDER — SODIUM CHLORIDE 0.9 % IV BOLUS
1000.0000 mL | Freq: Once | INTRAVENOUS | Status: AC
  Administered 2020-08-16: 1000 mL via INTRAVENOUS

## 2020-08-16 NOTE — Discharge Instructions (Addendum)
Your test today show that you have a low potassium level.  This is likely result of your persistent vomiting.  Is important that you take the potassium as prescribed.  You will need to have your potassium level rechecked later this week or first of next week.  Frequent small sips of clear fluids today.  Bland diet as tolerated.  Follow-up with your primary care provider for recheck.

## 2020-08-16 NOTE — ED Provider Notes (Signed)
[facility] EMERGENCY DEPARTMENT Provider Note   CSN: [id number] Arrival date & time: 08/16/20  0730     History Chief Complaint  Patient presents with   Emesis    Brenda Warren is a 30 y.o. female.   Emesis Associated symptoms: no abdominal pain, no arthralgias, no chills, no cough, no fever, no headaches, no myalgias and no sore throat        Brenda Warren is a 30 y.o. female with past medical history of anxiety, asthma, cyclic vomiting syndrome, and migraines who presents to the Emergency Department complaining of persistent nausea, vomiting, and feels as though she may be dehydrated.  She is reports having an episode of her cyclic vomiting x4 days.  States that she has been to the hospital in [place] and urgent care was given injections of medication for her vomiting, but was unable to get IV fluids.  She states that she feels weak all over and dehydrated.  Some chest pain this morning.  States since receiving Phenergan yesterday, she has tolerated very small amounts of liquids, but continues to feel weak.  Last dose of Phenergan at 230 this morning.  She denies fever, chills, abdominal pain, diarrhea, and dysuria.     Past Medical History:  Diagnosis Date   Anxiety    Asthma    Cyclic vomiting syndrome    Depression    Migraines    Ovarian cyst     Patient Active Problem List   Diagnosis Date Noted   Nausea and vomiting 10/06/2011   Weight loss 10/06/2011   RUQ pain 10/06/2011    Past Surgical History:  Procedure Laterality Date   CHOLECYSTECTOMY     ESOPHAGOGASTRODUODENOSCOPY     EUS  11/14/2011   Procedure: ESOPHAGEAL ENDOSCOPIC ULTRASOUND (EUS) RADIAL;  Surgeon: Arta Silence, MD;  Location: [facility];  Service: Endoscopy;  Laterality: N/A;   right knee surgery     arthroscopy; plica syndrome      OB History   No obstetric history on file.     Family History  Problem Relation Age of Onset   Gallbladder disease Father     Social History    Tobacco Use   Smoking status: Never   Smokeless tobacco: Never  Substance Use Topics   Alcohol use: No   Drug use: No    Home Medications Prior to Admission medications   Medication Sig Start Date End Date Taking? Authorizing Provider  acetaminophen (TYLENOL) 500 MG tablet Take 1,000 mg by mouth every 6 (six) hours as needed for mild pain, moderate pain or headache.     [provider]  ALPRAZolam Duanne Moron) 0.25 MG tablet Take 0.125-0.25 mg by mouth at bedtime as needed for anxiety. For anxiety    [provider]  EPINEPHrine (EPI-PEN) 0.3 mg/0.3 mL DEVI Inject 0.3 mg into the muscle once.    [provider]  escitalopram (LEXAPRO) 20 MG tablet Take 20 mg by mouth at bedtime.    [provider]  fluticasone (FLONASE) 50 MCG/ACT nasal spray Place 2 sprays into both nostrils daily as needed for allergies.  03/02/15   [provider]  HYDROcodone-acetaminophen (NORCO/VICODIN) 5-325 MG tablet Take 2 tablets by mouth every 4 (four) hours as needed. 03/23/15   Tanna Furry, MD  MedroxyPROGESTERone Acetate 150 MG/ML SUSY Inject 1 mL into the muscle every 3 (three) months. 02/23/15   [provider]  promethazine (PHENERGAN) 25 MG tablet Take 1 tablet (25 mg total) by mouth every 6 (six)  hours as needed for nausea or vomiting. 03/23/15   Tanna Furry, MD  scopolamine (TRANSDERM-SCOP, 1.5 MG,) 1 MG/3DAYS Place 1 patch (1.5 mg total) onto the skin every 3 (three) days. 03/23/15   Tanna Furry, MD    Allergies    Banana, Orange concentrate [flavoring agent], Other, Peach [prunus persica], Tomato, Watermelon concentrate [citrullus vulgaris], Darvocet [propoxyphene n-acetaminophen], and Milk-related compounds  Review of Systems   Review of Systems  Constitutional:  Negative for chills, fatigue and fever.  HENT:  Negative for congestion, sore throat and trouble swallowing.   Respiratory:  Negative for cough, shortness of breath and wheezing.    Cardiovascular:  Negative for chest pain and palpitations.  Gastrointestinal:  Positive for nausea and vomiting. Negative for abdominal pain and blood in stool.  Genitourinary:  Negative for dysuria, flank pain and hematuria.  Musculoskeletal:  Negative for arthralgias, myalgias, neck pain and neck stiffness.  Skin:  Negative for rash.  Neurological:  Positive for weakness. Negative for dizziness, numbness and headaches.  Hematological:  Does not bruise/bleed easily.   Physical Exam Updated Vital Signs BP (!) 149/135 (BP Location: Right Arm)   Pulse (!) 120   Temp 99.2 F (37.3 C) (Oral)   Resp 18   Ht 5\' 2"  (1.575 m)   Wt 59 kg   SpO2 100%   BMI 23.78 kg/m   Physical Exam Vitals and nursing note reviewed.  Constitutional:      Appearance: Normal appearance. She is not ill-appearing.  HENT:     Mouth/Throat:     Mouth: Mucous membranes are dry.  Eyes:     Conjunctiva/sclera: Conjunctivae normal.     Pupils: Pupils are equal, round, and reactive to light.  Cardiovascular:     Rate and Rhythm: Regular rhythm. Tachycardia present.     Pulses: Normal pulses.  Pulmonary:     Effort: Pulmonary effort is normal.     Breath sounds: Normal breath sounds.  Abdominal:     General: There is no distension.     Palpations: Abdomen is soft.     Tenderness: There is no abdominal tenderness. There is no right CVA tenderness or left CVA tenderness.  Musculoskeletal:        General: Normal range of motion.     Right lower leg: No edema.     Left lower leg: No edema.  Skin:    General: Skin is warm.  Neurological:     General: No focal deficit present.     Mental Status: She is alert.     Sensory: No sensory deficit.     Motor: No weakness.    ED Results / Procedures / Treatments   Labs (all labs ordered are listed, but only abnormal results are displayed) Labs Reviewed  URINALYSIS, ROUTINE W REFLEX MICROSCOPIC - Abnormal; Notable for the following components:      Result  Value   Hgb urine dipstick MODERATE (*)    Ketones, ur 5 (*)    Protein, ur 100 (*)    All other components within normal limits  CBC WITH DIFFERENTIAL/PLATELET - Abnormal; Notable for the following components:   WBC 12.6 (*)    RBC 5.80 (*)    Hemoglobin 17.4 (*)    HCT 47.9 (*)    MCHC 36.3 (*)    Monocytes Absolute 1.7 (*)    All other components within normal limits  COMPREHENSIVE METABOLIC PANEL - Abnormal; Notable for the following components:   Sodium 130 (*)  Potassium 2.1 (*)    Chloride 84 (*)    Glucose, Bld 127 (*)    BUN 27 (*)    Creatinine, Ser 1.10 (*)    Total Protein 9.7 (*)    Albumin 5.6 (*)    Total Bilirubin 2.5 (*)    Anion gap 17 (*)    All other components within normal limits  BASIC METABOLIC PANEL - Abnormal; Notable for the following components:   Sodium 130 (*)    Potassium 2.9 (*)    Chloride 92 (*)    Glucose, Bld 108 (*)    BUN 23 (*)    Calcium 8.5 (*)    All other components within normal limits  RESP PANEL BY RT-PCR (FLU A&B, COVID) ARPGX2  MAGNESIUM  I-STAT BETA HCG BLOOD, ED ([facility], [facility], [facility] ONLY)    EKG EKG Interpretation  Date/Time:  Tuesday August 16 2020 10:04:36 EDT Ventricular Rate:  88 PR Interval:  122 QRS Duration: 84 QT Interval:  388 QTC Calculation: 470 R Axis:   80 Text Interpretation: Sinus rhythm Biatrial enlargement Minimal ST depression, inferior leads No significant change since last tracing Confirmed by Dorie Rank [phone]) on 08/16/2020 10:44:31 AM  Radiology No results found.  Procedures Procedures   Medications Ordered in ED Medications  sodium chloride 0.9 % bolus 1,000 mL (has no administration in time range)  ondansetron (ZOFRAN) injection 4 mg (has no administration in time range)    ED Course  I have reviewed the triage vital signs and the nursing notes.  Pertinent labs & imaging results that were available during my care of the patient were reviewed by me and considered in my medical decision  making (see chart for details).    MDM Rules/Calculators/A&P                          Patient with history of cyclic vomiting reports persistent vomiting x4 days.  Has been taking antiemetic with minimal relief.  Current symptoms feel similar to previous episodes of her cyclic vomiting.  No abdominal pain on exam.  Mucous membranes appear dry.  She is tachycardic.  Nontoxic-appearing.  Low clinical suspicion for acute abdomen.  IVF's given, tachycardia improved.  Nausea and vomiting improved after the fluids and antiemetic.  Labs show hypokalemia with potassium of 2.1.  Sodium 130, creatinine elevated along with BUN.  In the setting of vomiting this is to be related dehydration.  COVID and influenza testing negative.  Patient is not pregnant.  No leukocytosis low clinical suspicion of infectious process.  EKG without acute ischemic changes, will administer IV potassium and reassess potassium level.  Patient has completed 3 rounds of IV potassium here on recheck potassium improved, now 2.9, creatinine has normalized.  Patient no longer vomiting and reports that she feels better and requesting discharge home.  She has tolerated oral fluids here and has ambulated to the bathroom without difficulty.  I feel that she is appropriate for discharge home, she has promethazine at home for vomiting, but has requested prescription for Zofran instead.  We will also provide prescription for oral potassium and she verbalizes understanding that she will need to have her potassium level rechecked in 1 week.    Final Clinical Impression(s) / ED Diagnoses Final diagnoses:  Cyclical vomiting  Hypokalemia    Rx / DC Orders ED Discharge Orders          Ordered    ondansetron (ZOFRAN ODT) 4 MG  disintegrating tablet  Every 8 hours PRN        08/16/20 1444    potassium chloride SA (KLOR-CON) 20 MEQ tablet  2 times daily        08/16/20 1444             Kem Parkinson, PA-C 08/19/20 1345    Dorie Rank, MD 08/20/20 1121

## 2020-08-16 NOTE — ED Triage Notes (Signed)
Pt presents to ED with cyclic vomiting and states she has had vomiting since Friday night and needs fluids.

## 2022-07-30 ENCOUNTER — Other Ambulatory Visit: Payer: Self-pay | Admitting: Family Medicine

## 2022-07-30 ENCOUNTER — Other Ambulatory Visit (HOSPITAL_COMMUNITY): Admission: RE | Admit: 2022-07-30 | Payer: Federal, State, Local not specified - PPO | Source: Ambulatory Visit

## 2022-07-30 DIAGNOSIS — Z01411 Encounter for gynecological examination (general) (routine) with abnormal findings: Secondary | ICD-10-CM | POA: Insufficient documentation

## 2022-08-01 LAB — CYTOLOGY - PAP
Comment: NEGATIVE
Diagnosis: NEGATIVE
Diagnosis: REACTIVE
High risk HPV: NEGATIVE
